# Patient Record
Sex: Male | Born: 1980 | Race: White | Hispanic: No | Marital: Single | Smoking: Current every day smoker
Health system: Southern US, Community
[De-identification: ages and names within clinical notes are randomized; demographics above are authoritative.]

## PROBLEM LIST (undated history)

## (undated) ENCOUNTER — Ambulatory Visit: Discharge status: Absent without leave | Payer: Self-pay

## (undated) DIAGNOSIS — Z720 Tobacco use: Secondary | ICD-10-CM

## (undated) DIAGNOSIS — T7840XA Allergy, unspecified, initial encounter: Secondary | ICD-10-CM

## (undated) DIAGNOSIS — F419 Anxiety disorder, unspecified: Secondary | ICD-10-CM

## (undated) HISTORY — PX: HAND SURGERY: SHX662

## (undated) HISTORY — DX: Tobacco use: Z72.0

## (undated) HISTORY — DX: Allergy, unspecified, initial encounter: T78.40XA

---

## 2002-04-04 ENCOUNTER — Emergency Department (HOSPITAL_COMMUNITY): Admission: EM | Admit: 2002-04-04 | Discharge: 2002-04-05 | Payer: Self-pay | Admitting: Emergency Medicine

## 2002-04-05 ENCOUNTER — Encounter: Payer: Self-pay | Admitting: *Deleted

## 2002-04-05 ENCOUNTER — Encounter: Payer: Self-pay | Admitting: Emergency Medicine

## 2002-05-02 ENCOUNTER — Emergency Department (HOSPITAL_COMMUNITY): Admission: EM | Admit: 2002-05-02 | Discharge: 2002-05-02 | Payer: Self-pay | Admitting: Internal Medicine

## 2010-06-13 ENCOUNTER — Emergency Department (HOSPITAL_COMMUNITY): Admission: EM | Admit: 2010-06-13 | Discharge: 2010-06-13 | Payer: Self-pay | Admitting: Emergency Medicine

## 2010-06-18 ENCOUNTER — Ambulatory Visit (HOSPITAL_BASED_OUTPATIENT_CLINIC_OR_DEPARTMENT_OTHER): Admission: RE | Admit: 2010-06-18 | Discharge: 2010-06-18 | Payer: Self-pay | Admitting: Orthopedic Surgery

## 2010-06-26 ENCOUNTER — Encounter (INDEPENDENT_AMBULATORY_CARE_PROVIDER_SITE_OTHER): Payer: Self-pay | Admitting: *Deleted

## 2010-10-27 NOTE — Letter (Signed)
Summary: *Orthopedic No Show Letter  [facility]  [address]  [place], [place] [address]   Phone: [phone]  Fax: [phone]      06/26/2010    Jeffrey Wyatt [address] [place], [place]  [address]    Dear [name],   Our records indicate that you missed your scheduled appointment with Dr. Beaulah Corin on  06/22/10, following your visit to [facility] Emergency Room.   Please contact this office to reschedule your appointment as soon as possible.  It is important that you keep your scheduled appointments with your physician, so we can provide you the best care possible.        Sincerely,   Dr. Terrance Mass, MD [facility] Phone [phone]

## 2010-12-10 LAB — POCT HEMOGLOBIN-HEMACUE: Hemoglobin: 14 g/dL (ref 13.0–17.0)

## 2013-09-08 ENCOUNTER — Encounter (HOSPITAL_COMMUNITY): Payer: Self-pay | Admitting: Emergency Medicine

## 2013-09-08 ENCOUNTER — Emergency Department (HOSPITAL_COMMUNITY)
Admission: EM | Admit: 2013-09-08 | Discharge: 2013-09-08 | Disposition: A | Discharge status: Home / Self Care | Payer: No Typology Code available for payment source | Attending: Emergency Medicine | Admitting: Emergency Medicine

## 2013-09-08 DIAGNOSIS — F17200 Nicotine dependence, unspecified, uncomplicated: Secondary | ICD-10-CM | POA: Insufficient documentation

## 2013-09-08 DIAGNOSIS — Y92410 Unspecified street and highway as the place of occurrence of the external cause: Secondary | ICD-10-CM | POA: Insufficient documentation

## 2013-09-08 DIAGNOSIS — S139XXA Sprain of joints and ligaments of unspecified parts of neck, initial encounter: Secondary | ICD-10-CM | POA: Insufficient documentation

## 2013-09-08 DIAGNOSIS — S0003XA Contusion of scalp, initial encounter: Secondary | ICD-10-CM | POA: Insufficient documentation

## 2013-09-08 DIAGNOSIS — Y9389 Activity, other specified: Secondary | ICD-10-CM | POA: Insufficient documentation

## 2013-09-08 DIAGNOSIS — S161XXA Strain of muscle, fascia and tendon at neck level, initial encounter: Secondary | ICD-10-CM

## 2013-09-08 DIAGNOSIS — S0990XA Unspecified injury of head, initial encounter: Secondary | ICD-10-CM | POA: Insufficient documentation

## 2013-09-08 MED ORDER — IBUPROFEN 800 MG PO TABS: 800.0000 mg | ORAL_TABLET | Freq: Three times a day (TID) | ORAL | Status: DC

## 2013-09-08 MED ORDER — CYCLOBENZAPRINE HCL 10 MG PO TABS: 10.0000 mg | ORAL_TABLET | Freq: Three times a day (TID) | ORAL | Status: DC | PRN
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
## 2014-05-23 LAB — HIV ANTIBODY (ROUTINE TESTING W REFLEX): HIV 1&2 Ab, 4th Generation: NONREACTIVE

## 2014-07-04 ENCOUNTER — Ambulatory Visit (INDEPENDENT_AMBULATORY_CARE_PROVIDER_SITE_OTHER): Payer: BC Managed Care – PPO | Admitting: Otolaryngology

## 2014-07-04 DIAGNOSIS — R1312 Dysphagia, oropharyngeal phase: Secondary | ICD-10-CM

## 2014-07-05 ENCOUNTER — Other Ambulatory Visit (INDEPENDENT_AMBULATORY_CARE_PROVIDER_SITE_OTHER): Payer: Self-pay | Admitting: Otolaryngology

## 2014-07-05 DIAGNOSIS — R1310 Dysphagia, unspecified: Secondary | ICD-10-CM

## 2014-07-09 ENCOUNTER — Inpatient Hospital Stay (HOSPITAL_COMMUNITY): Admission: RE | Admit: 2014-07-09 | Payer: BC Managed Care – PPO | Source: Ambulatory Visit

## 2014-07-29 ENCOUNTER — Ambulatory Visit (HOSPITAL_COMMUNITY)
Admission: RE | Admit: 2014-07-29 | Discharge: 2014-07-29 | Disposition: A | Discharge status: Home / Self Care | Payer: BC Managed Care – PPO | Source: Ambulatory Visit | Attending: Otolaryngology | Admitting: Otolaryngology

## 2014-07-29 DIAGNOSIS — R1310 Dysphagia, unspecified: Secondary | ICD-10-CM | POA: Insufficient documentation

## 2014-08-01 ENCOUNTER — Ambulatory Visit (INDEPENDENT_AMBULATORY_CARE_PROVIDER_SITE_OTHER): Payer: BC Managed Care – PPO | Admitting: Otolaryngology

## 2015-03-13 ENCOUNTER — Encounter: Payer: Self-pay | Admitting: Gastroenterology

## 2015-04-09 ENCOUNTER — Ambulatory Visit (INDEPENDENT_AMBULATORY_CARE_PROVIDER_SITE_OTHER): Payer: BLUE CROSS/BLUE SHIELD | Admitting: Gastroenterology

## 2015-04-09 ENCOUNTER — Other Ambulatory Visit: Payer: Self-pay

## 2015-04-09 ENCOUNTER — Encounter: Payer: Self-pay | Admitting: Gastroenterology

## 2015-04-09 VITALS — BP 139/84 | HR 88 | Temp 98.4°F | Ht 73.0 in | Wt 163.8 lb

## 2015-04-09 DIAGNOSIS — R1310 Dysphagia, unspecified: Secondary | ICD-10-CM

## 2015-04-09 DIAGNOSIS — R1314 Dysphagia, pharyngoesophageal phase: Secondary | ICD-10-CM | POA: Diagnosis not present

## 2015-04-09 DIAGNOSIS — R1319 Other dysphagia: Secondary | ICD-10-CM

## 2015-04-09 NOTE — Progress Notes (Signed)
Primary Care Physician:  GOLDING, JOHN CABOT, MD  Primary Gastroenterologist:  Michael Rourk, MD   Chief Complaint  Patient presents with  . Dysphagia    HPI:  Jeffrey Wyatt is a 33 y.o. male here for further evaluation of difficulty swallowing, at the request of Dr. John Golding. Patient reports symptoms going on for about a year. Has trouble swallowing almost any solid food. Feels food stick in his chest. At times has had to vomit the food up to get relief. Does okay with liquids. Nothing seems to make better. Worse if he doesn't chew food thoroughly. No associated weight loss. Rarely has heartburn. No abdominal pain. Bowel function is normal. No blood in the stool or melena.  Patient states he saw Dr. Teoh last year for similar symptoms and was diagnosed with acid reflux. States he was on medication for short period of time but didn't seem to help. States he had a fiberoptic study which showed the reflux. Barium esophagram normal in 07/2014.      Current Outpatient Prescriptions  Medication Sig Dispense Refill  . CHANTIX STARTING MONTH PAK 0.5 MG X 11 & 1 MG X 42 tablet      No current facility-administered medications for this visit.    Allergies as of 04/09/2015  . (No Known Allergies)    Past Medical History  Diagnosis Date  . Tobacco use     Past Surgical History  Procedure Laterality Date  . Hand surgery      left    Family History  Problem Relation Age of Onset  . Colon cancer Neg Hx     History   Social History  . Marital Status: Single    Spouse Name: N/A  . Number of Children: 0  . Years of Education: N/A   Occupational History  . [employer]    Social History Main Topics  . Smoking status: Current Every Day Smoker  . Smokeless tobacco: Not on file  . Alcohol Use: No  . Drug Use: No  . Sexual Activity: Not on file   Other Topics Concern  . Not on file   Social History Narrative      ROS:  General: Negative for anorexia, weight loss,  fever, chills, fatigue, weakness. Eyes: Negative for vision changes.  ENT: Negative for hoarseness, difficulty swallowing , nasal congestion. CV: Negative for chest pain, angina, palpitations, dyspnea on exertion, peripheral edema.  Respiratory: Negative for dyspnea at rest, dyspnea on exertion, cough, sputum, wheezing.  GI: See history of present illness. GU:  Negative for dysuria, hematuria, urinary incontinence, urinary frequency, nocturnal urination.  MS: Negative for joint pain, low back pain.  Derm: Negative for rash or itching.  Neuro: Negative for weakness, abnormal sensation, seizure, frequent headaches, memory loss, confusion.  Psych: Negative for anxiety, depression, suicidal ideation, hallucinations.  Endo: Negative for unusual weight change.  Heme: Negative for bruising or bleeding. Allergy: Negative for rash or hives.    Physical Examination:  BP 139/84 mmHg  Pulse 88  Temp(Src) 98.4 F (36.9 C) (Oral)  Ht 6' 1" (1.854 m)  Wt 163 lb 12.8 oz (74.299 kg)  BMI 21.62 kg/m2   General: Well-nourished, well-developed in no acute distress.  Head: Normocephalic, atraumatic.   Eyes: Conjunctiva pink, no icterus. Mouth: Oropharyngeal mucosa moist and pink , no lesions erythema or exudate. Neck: Supple without thyromegaly, masses, or lymphadenopathy.  Lungs: Clear to auscultation bilaterally.  Heart: Regular rate and rhythm, no murmurs rubs or gallops.  Abdomen: Bowel   sounds are normal, nontender, nondistended, no hepatosplenomegaly or masses, no abdominal bruits or    hernia , no rebound or guarding.   Rectal: not performed Extremities: No lower extremity edema. No clubbing or deformities.  Neuro: Alert and oriented x 4 , grossly normal neurologically.  Skin: Warm and dry, no rash or jaundice.   Psych: Alert and cooperative, normal mood and affect.   Imaging Studies: No results found.

## 2015-04-09 NOTE — Patient Instructions (Signed)
Upper endoscopy as scheduled. See separate instructions.  

## 2015-04-09 NOTE — Assessment & Plan Note (Signed)
One-year history of solid food esophageal dysphagia without significant reflux symptoms. Patient reports diagnosis of GERD by ENT at time of fiberoptic study. PPI therapy did not seem to make a difference. At this point given ongoing symptoms would recommend upper endoscopy with esophageal dilation. Differential diagnosis includes eosinophilic esophagitis, esophageal web/ring/stricture  I have discussed the risks, alternatives, benefits with regards to but not limited to the risk of reaction to medication, bleeding, infection, perforation and the patient is agreeable to proceed. Written consent to be obtained.

## 2015-04-10 ENCOUNTER — Ambulatory Visit (HOSPITAL_COMMUNITY)
Admission: RE | Admit: 2015-04-10 | Discharge: 2015-04-10 | Disposition: A | Discharge status: Home / Self Care | Payer: BLUE CROSS/BLUE SHIELD | Source: Ambulatory Visit | Attending: Internal Medicine | Admitting: Internal Medicine

## 2015-04-10 ENCOUNTER — Encounter (HOSPITAL_COMMUNITY): Payer: Self-pay | Admitting: *Deleted

## 2015-04-10 ENCOUNTER — Encounter (HOSPITAL_COMMUNITY)
Admission: RE | Disposition: A | Discharge status: Home / Self Care | Payer: Self-pay | Source: Ambulatory Visit | Attending: Internal Medicine

## 2015-04-10 DIAGNOSIS — Z720 Tobacco use: Secondary | ICD-10-CM | POA: Diagnosis not present

## 2015-04-10 DIAGNOSIS — K2100 Gastro-esophageal reflux disease with esophagitis, without bleeding: Secondary | ICD-10-CM | POA: Insufficient documentation

## 2015-04-10 DIAGNOSIS — R1314 Dysphagia, pharyngoesophageal phase: Secondary | ICD-10-CM | POA: Insufficient documentation

## 2015-04-10 DIAGNOSIS — K449 Diaphragmatic hernia without obstruction or gangrene: Secondary | ICD-10-CM | POA: Insufficient documentation

## 2015-04-10 DIAGNOSIS — K3189 Other diseases of stomach and duodenum: Secondary | ICD-10-CM | POA: Insufficient documentation

## 2015-04-10 HISTORY — PX: ESOPHAGEAL DILATION: SHX303

## 2015-04-10 HISTORY — PX: ESOPHAGOGASTRODUODENOSCOPY: SHX5428

## 2015-04-10 SURGERY — EGD (ESOPHAGOGASTRODUODENOSCOPY)
Anesthesia: Moderate Sedation

## 2015-04-10 MED ORDER — MEPERIDINE HCL 100 MG/ML IJ SOLN
INTRAMUSCULAR | Status: DC | PRN
  Administered 2015-04-10: 25 mg via INTRAVENOUS
  Administered 2015-04-10 (×2): 50 mg via INTRAVENOUS

## 2015-04-10 MED ORDER — MEPERIDINE HCL 100 MG/ML IJ SOLN
INTRAMUSCULAR | Status: AC
  Filled 2015-04-10: qty 2

## 2015-04-10 MED ORDER — MIDAZOLAM HCL 5 MG/5ML IJ SOLN
INTRAMUSCULAR | Status: AC
  Filled 2015-04-10: qty 10

## 2015-04-10 MED ORDER — ONDANSETRON HCL 4 MG/2ML IJ SOLN
INTRAMUSCULAR | Status: DC | PRN
  Administered 2015-04-10: 4 mg via INTRAVENOUS

## 2015-04-10 MED ORDER — SODIUM CHLORIDE 0.9 % IV SOLN
INTRAVENOUS | Status: DC
  Administered 2015-04-10: 13:00:00 via INTRAVENOUS

## 2015-04-10 MED ORDER — LIDOCAINE VISCOUS 2 % MT SOLN
OROMUCOSAL | Status: DC | PRN
  Administered 2015-04-10: 3 mL via OROMUCOSAL

## 2015-04-10 MED ORDER — ONDANSETRON HCL 4 MG/2ML IJ SOLN
INTRAMUSCULAR | Status: AC
  Filled 2015-04-10: qty 2

## 2015-04-10 MED ORDER — SIMETHICONE 40 MG/0.6ML PO SUSP
ORAL | Status: DC | PRN
  Administered 2015-04-10: 14:00:00

## 2015-04-10 MED ORDER — MIDAZOLAM HCL 5 MG/5ML IJ SOLN
INTRAMUSCULAR | Status: DC | PRN
  Administered 2015-04-10: 1 mg via INTRAVENOUS
  Administered 2015-04-10 (×3): 2 mg via INTRAVENOUS
  Administered 2015-04-10: 1 mg via INTRAVENOUS

## 2015-04-10 MED ORDER — LIDOCAINE VISCOUS 2 % MT SOLN
OROMUCOSAL | Status: AC
  Filled 2015-04-10: qty 15

## 2015-04-10 NOTE — Discharge Instructions (Addendum)
EGD Discharge instructions Please read the instructions outlined below and refer to this sheet in the next few weeks. These discharge instructions provide you with general information on caring for yourself after you leave the hospital. Your doctor may also give you specific instructions. While your treatment has been planned according to the most current medical practices available, unavoidable complications occasionally occur. If you have any problems or questions after discharge, please call your doctor. ACTIVITY  You may resume your regular activity but move at a slower pace for the next 24 hours.   Take frequent rest periods for the next 24 hours.   Walking will help expel (get rid of) the air and reduce the bloated feeling in your abdomen.   No driving for 24 hours (because of the anesthesia (medicine) used during the test).   You may shower.   Do not sign any important legal documents or operate any machinery for 24 hours (because of the anesthesia used during the test).  NUTRITION  Drink plenty of fluids.   You may resume your normal diet.   Begin with a light meal and progress to your normal diet.   Avoid alcoholic beverages for 24 hours or as instructed by your caregiver.  MEDICATIONS  You may resume your normal medications unless your caregiver tells you otherwise.  WHAT YOU CAN EXPECT TODAY  You may experience abdominal discomfort such as a feeling of fullness or gas pains.  FOLLOW-UP  Your doctor will discuss the results of your test with you.  SEEK IMMEDIATE MEDICAL ATTENTION IF ANY OF THE FOLLOWING OCCUR:  Excessive nausea (feeling sick to your stomach) and/or vomiting.   Severe abdominal pain and distention (swelling).   Trouble swallowing.   Temperature over 101 F (37.8 C).   Rectal bleeding or vomiting of blood.    GERD information provided  Would cut back on Goody powders-they are damaging your stomach  Begin Dexilant 60 mg daily- go by my  office for free samples  Further recommendations to follow pending review of pathology report  Gastroesophageal Reflux Disease, Adult Gastroesophageal reflux disease (GERD) happens when acid from your stomach flows up into the esophagus. When acid comes in contact with the esophagus, the acid causes soreness (inflammation) in the esophagus. Over time, GERD may create small holes (ulcers) in the lining of the esophagus. CAUSES   Increased body weight. This puts pressure on the stomach, making acid rise from the stomach into the esophagus.  Smoking. This increases acid production in the stomach.  Drinking alcohol. This causes decreased pressure in the lower esophageal sphincter (valve or ring of muscle between the esophagus and stomach), allowing acid from the stomach into the esophagus.  Late evening meals and a full stomach. This increases pressure and acid production in the stomach.  A malformed lower esophageal sphincter. Sometimes, no cause is found. SYMPTOMS   Burning pain in the lower part of the mid-chest behind the breastbone and in the mid-stomach area. This may occur twice a week or more often.  Trouble swallowing.  Sore throat.  Dry cough.  Asthma-like symptoms including chest tightness, shortness of breath, or wheezing. DIAGNOSIS  Your caregiver may be able to diagnose GERD based on your symptoms. In some cases, X-rays and other tests may be done to check for complications or to check the condition of your stomach and esophagus. TREATMENT  Your caregiver may recommend over-the-counter or prescription medicines to help decrease acid production. Ask your caregiver before starting or adding any new  medicines.  HOME CARE INSTRUCTIONS   Change the factors that you can control. Ask your caregiver for guidance concerning weight loss, quitting smoking, and alcohol consumption.  Avoid foods and drinks that make your symptoms worse, such as:  Caffeine or alcoholic  drinks.  Chocolate.  Peppermint or mint flavorings.  Garlic and onions.  Spicy foods.  Citrus fruits, such as oranges, lemons, or limes.  Tomato-based foods such as sauce, chili, salsa, and pizza.  Fried and fatty foods.  Avoid lying down for the 3 hours prior to your bedtime or prior to taking a nap.  Eat small, frequent meals instead of large meals.  Wear loose-fitting clothing. Do not wear anything tight around your waist that causes pressure on your stomach.  Raise the head of your bed 6 to 8 inches with wood blocks to help you sleep. Extra pillows will not help.  Only take over-the-counter or prescription medicines for pain, discomfort, or fever as directed by your caregiver.  Do not take aspirin, ibuprofen, or other nonsteroidal anti-inflammatory drugs (NSAIDs). SEEK IMMEDIATE MEDICAL CARE IF:   You have pain in your arms, neck, jaw, teeth, or back.  Your pain increases or changes in intensity or duration.  You develop nausea, vomiting, or sweating (diaphoresis).  You develop shortness of breath, or you faint.  Your vomit is green, yellow, black, or looks like coffee grounds or blood.  Your stool is red, bloody, or black. These symptoms could be signs of other problems, such as heart disease, gastric bleeding, or esophageal bleeding. MAKE SURE YOU:   Understand these instructions.  Will watch your condition.  Will get help right away if you are not doing well or get worse. Document Released: 06/23/2005 Document Revised: 12/06/2011 Document Reviewed: 04/02/2011 [facility] Patient Information 2015 [employer] [place]. This information is not intended to replace advice given to you by your health care provider. Make sure you discuss any questions you have with your health care provider.

## 2015-04-10 NOTE — H&P (View-Only) (Signed)
Primary Care Physician:  Colette Ribas, MD  Primary Gastroenterologist:  Roetta Sessions, MD   Chief Complaint  Patient presents with  . Dysphagia    HPI:  Jeffrey Wyatt is a 34 y.o. male here for further evaluation of difficulty swallowing, at the request of Dr. Assunta Found. Patient reports symptoms going on for about a year. Has trouble swallowing almost any solid food. Feels food stick in his chest. At times has had to vomit the food up to get relief. Does okay with liquids. Nothing seems to make better. Worse if he doesn't chew food thoroughly. No associated weight loss. Rarely has heartburn. No abdominal pain. Bowel function is normal. No blood in the stool or melena.  Patient states he saw Dr. Suszanne Conners last year for similar symptoms and was diagnosed with acid reflux. States he was on medication for short period of time but didn't seem to help. States he had a fiberoptic study which showed the reflux. Barium esophagram normal in 07/2014.      Current Outpatient Prescriptions  Medication Sig Dispense Refill  . CHANTIX STARTING MONTH PAK 0.5 MG X 11 & 1 MG X 42 tablet      No current facility-administered medications for this visit.    Allergies as of 04/09/2015  . (No Known Allergies)    Past Medical History  Diagnosis Date  . Tobacco use     Past Surgical History  Procedure Laterality Date  . Hand surgery      left    Family History  Problem Relation Age of Onset  . Colon cancer Neg Hx     History   Social History  . Marital Status: Single    Spouse Name: N/A  . Number of Children: 0  . Years of Education: N/A   Occupational History  . [employer]    Social History Main Topics  . Smoking status: Current Every Day Smoker  . Smokeless tobacco: Not on file  . Alcohol Use: No  . Drug Use: No  . Sexual Activity: Not on file   Other Topics Concern  . Not on file   Social History Narrative      ROS:  General: Negative for anorexia, weight loss,  fever, chills, fatigue, weakness. Eyes: Negative for vision changes.  ENT: Negative for hoarseness, difficulty swallowing , nasal congestion. CV: Negative for chest pain, angina, palpitations, dyspnea on exertion, peripheral edema.  Respiratory: Negative for dyspnea at rest, dyspnea on exertion, cough, sputum, wheezing.  GI: See history of present illness. GU:  Negative for dysuria, hematuria, urinary incontinence, urinary frequency, nocturnal urination.  MS: Negative for joint pain, low back pain.  Derm: Negative for rash or itching.  Neuro: Negative for weakness, abnormal sensation, seizure, frequent headaches, memory loss, confusion.  Psych: Negative for anxiety, depression, suicidal ideation, hallucinations.  Endo: Negative for unusual weight change.  Heme: Negative for bruising or bleeding. Allergy: Negative for rash or hives.    Physical Examination:  BP 139/84 mmHg  Pulse 88  Temp(Src) 98.4 F (36.9 C) (Oral)  Ht  (1.854 m)  Wt 163 lb 12.8 oz (74.299 kg)  BMI 21.62 kg/m2   General: Well-nourished, well-developed in no acute distress.  Head: Normocephalic, atraumatic.   Eyes: Conjunctiva pink, no icterus. Mouth: Oropharyngeal mucosa moist and pink , no lesions erythema or exudate. Neck: Supple without thyromegaly, masses, or lymphadenopathy.  Lungs: Clear to auscultation bilaterally.  Heart: Regular rate and rhythm, no murmurs rubs or gallops.  Abdomen: Bowel  sounds are normal, nontender, nondistended, no hepatosplenomegaly or masses, no abdominal bruits or    hernia , no rebound or guarding.   Rectal: not performed Extremities: No lower extremity edema. No clubbing or deformities.  Neuro: Alert and oriented x 4 , grossly normal neurologically.  Skin: Warm and dry, no rash or jaundice.   Psych: Alert and cooperative, normal mood and affect.   Imaging Studies: No results found.

## 2015-04-10 NOTE — Interval H&P Note (Signed)
History and Physical Interval Note:  04/10/2015 1:40 PM  Jeffrey Wyatt  has presented today for surgery, with the diagnosis of dysphagia  The various methods of treatment have been discussed with the patient and family. After consideration of risks, benefits and other options for treatment, the patient has consented to  Procedure(s) with comments: ESOPHAGOGASTRODUODENOSCOPY (EGD) (N/A) - 200pm ESOPHAGEAL DILATION (N/A) as a surgical intervention .  The patient's history has been reviewed, patient examined, no change in status, stable for surgery.  I have reviewed the patient's chart and labs.  Questions were answered to the patient's satisfaction.     Jeffrey Wyatt  No change. EGD with esophageal dilation as feasible/appropriate per plan.The risks, benefits, limitations, alternatives and imponderables have been reviewed with the patient. Potential for esophageal dilation, biopsy, etc. have also been reviewed.  Questions have been answered. All parties agreeable.

## 2015-04-10 NOTE — Op Note (Signed)
[facility] [address] [place], [id number]   ENDOSCOPY PROCEDURE REPORT  PATIENT: Jeffrey Wyatt, Jeffrey Wyatt  MR#: [id number] BIRTHDATE: [birth year] , 33  yrs. old GENDER: male ENDOSCOPIST: R.  Roetta SessionsMichael Myha Arizpe, MD FACP FACG REFERRED BY:  Assunta FoundJohn Golding, M.D. PROCEDURE DATE:  04/10/2015 PROCEDURE: INDICATIONS:  Esophageal dysphagia; GERD. MEDICATIONS: Versed 8 mg IV and Demerol 125 mg IV in divided doses. Xylocaine gel orally.  Zofran 4 mg IV. ASA CLASS:      Class II  CONSENT: The risks, benefits, limitations, alternatives and imponderables have been discussed.  The potential for biopsy, esophogeal dilation, etc. have also been reviewed.  Questions have been answered.  All parties agreeable.  Please see the history and physical in the medical record for more information.  DESCRIPTION OF PROCEDURE: After the risks benefits and alternatives of the procedure were thoroughly explained, informed consent was obtained.  The EG-[id number] ([id number] endoscope was introduced through the mouth and advanced to the second portion of the duodenum , limited by Without limitations. The instrument was slowly withdrawn as the mucosa was fully examined. Estimated blood loss is zero unless otherwise noted in this procedure report.    Multiple distal esophageal erosions with mucosal breaks extending from the EG junction 2 cm proximally and at least 4 locations.  The tubular esophagus did appear patent throughout its course.  No nodularity.  No evidence of Barrett's esophagus.  EG junction easily traversed.  Stomach empty.  Small hiatal hernia.  Multiple antral erosions.  No ulcer infiltrating process.  Patent pylorus. Normal-appearing first and second portion of the duodenum  The scope was removed and a 56 [place]French Maloney dilator was passed to full insertion with ease.  A look back revealed no apparent complication related to this maneuver.  Subsequently, biopsies abnormal antrum taken  for histologic study. Retroflexed views revealed a hiatal hernia.     The scope was then withdrawn from the patient and the procedure completed.  COMPLICATIONS: There were no immediate complications.  ENDOSCOPIC IMPRESSION: Erosive reflux esophagitis. Hiatal hernia. Gastric erosions status post gastric biopsy. Patient's family told me after the procedure patient "eats Marlin CanaryGoody powders all the time"  RECOMMENDATIONS: Minimize aspirin use. Begin Dexilant 60 mg daily. Follow up on pathology. Further recommendations to follow.  REPEAT EXAM:  eSigned:  R. Roetta SessionsMichael Adreona Brand, MD Jerrel IvoryFACP [facility] HospitalFACG 04/10/2015 2:29 PM    CC:  CPT CODES: ICD CODES:  The ICD and CPT codes recommended by this software are interpretations from the data that the clinical staff has captured with the software.  The verification of the translation of this report to the ICD and CPT codes and modifiers is the sole responsibility of the health care institution and practicing physician where this report was generated.  [employer] will not be held responsible for the validity of the ICD and CPT codes included on this report.  AMA assumes no liability for data contained or not contained herein. CPT is a Publishing rights managerregistered trademark of the [employer].  PATIENT NAME:  Jeffrey Wyatt, Jeffrey Wyatt MR#: [id number]

## 2015-04-11 ENCOUNTER — Encounter (HOSPITAL_COMMUNITY): Payer: Self-pay | Admitting: Internal Medicine

## 2015-04-14 NOTE — Progress Notes (Signed)
CC'ED TO PCP 

## 2015-04-15 ENCOUNTER — Encounter: Payer: Self-pay | Admitting: Internal Medicine

## 2015-04-23 ENCOUNTER — Emergency Department (HOSPITAL_COMMUNITY)
Admission: EM | Admit: 2015-04-23 | Discharge: 2015-04-23 | Disposition: A | Discharge status: Home / Self Care | Payer: BLUE CROSS/BLUE SHIELD | Attending: Emergency Medicine | Admitting: Emergency Medicine

## 2015-04-23 ENCOUNTER — Encounter (HOSPITAL_COMMUNITY): Payer: Self-pay | Admitting: Emergency Medicine

## 2015-04-23 ENCOUNTER — Emergency Department (HOSPITAL_COMMUNITY): Payer: BLUE CROSS/BLUE SHIELD

## 2015-04-23 DIAGNOSIS — Z79899 Other long term (current) drug therapy: Secondary | ICD-10-CM | POA: Diagnosis not present

## 2015-04-23 DIAGNOSIS — M546 Pain in thoracic spine: Secondary | ICD-10-CM | POA: Diagnosis present

## 2015-04-23 DIAGNOSIS — M549 Dorsalgia, unspecified: Secondary | ICD-10-CM

## 2015-04-23 DIAGNOSIS — Z720 Tobacco use: Secondary | ICD-10-CM | POA: Diagnosis not present

## 2015-04-23 MED ORDER — OXYCODONE-ACETAMINOPHEN 5-325 MG PO TABS
1.0000 | ORAL_TABLET | ORAL | Status: DC | PRN
Start: 2015-04-23 — End: 2018-10-03

## 2015-04-23 MED ORDER — OXYCODONE-ACETAMINOPHEN 5-325 MG PO TABS
1.0000 | ORAL_TABLET | Freq: Once | ORAL | Status: AC
  Administered 2015-04-23: 1 via ORAL
  Filled 2015-04-23: qty 1

## 2015-04-23 MED ORDER — CYCLOBENZAPRINE HCL 10 MG PO TABS
10.0000 mg | ORAL_TABLET | Freq: Once | ORAL | Status: AC
  Administered 2015-04-23: 10 mg via ORAL
  Filled 2015-04-23: qty 1

## 2015-04-23 MED ORDER — CYCLOBENZAPRINE HCL 10 MG PO TABS: 10.0000 mg | ORAL_TABLET | Freq: Two times a day (BID) | ORAL | Status: DC | PRN

## 2015-04-23 NOTE — ED Notes (Signed)
Pt c/o severe upper back pain since Sunday.

## 2015-04-23 NOTE — ED Notes (Addendum)
Pt states understanding of care given and follow-up instructions.  Reminded pt once again no drinking ETOH or driving a vehicle tonight due to medications given.  Informed no ETOH or driving while taking prescription medications.  Pt ambulated out of ED with Aunt whom agreed to transport pt home

## 2015-04-23 NOTE — ED Provider Notes (Signed)
CSN: [id number]     Arrival date & time 04/23/15  0118 History   First MD Initiated Contact with Patient 04/23/15 0121     Chief Complaint  Patient presents with  . Back Pain     (Consider location/radiation/quality/duration/timing/severity/associated sxs/prior Treatment) The history is provided by the patient.   34 year old male comes in complaining of pain in the right scapular area for the last 4 days. Pain is sharp and worse with movement. There is no radiation of pain. He cannot recall any unusual activity or trauma to precipitate the pain. Pain is rated at 8/10. He has tried taking ibuprofen with no relief.  Past Medical History  Diagnosis Date  . Tobacco use    Past Surgical History  Procedure Laterality Date  . Hand surgery      left  . Esophagogastroduodenoscopy N/A 04/10/2015    Procedure: ESOPHAGOGASTRODUODENOSCOPY (EGD);  Surgeon: Corbin Ade, MD;  Location: [facility];  Service: Endoscopy;  Laterality: N/A;  200pm  . Esophageal dilation N/A 04/10/2015    Procedure: ESOPHAGEAL DILATION;  Surgeon: Corbin Ade, MD;  Location: [facility];  Service: Endoscopy;  Laterality: N/A;   Family History  Problem Relation Age of Onset  . Colon cancer Neg Hx    History  Substance Use Topics  . Smoking status: Current Every Day Smoker  . Smokeless tobacco: Not on file  . Alcohol Use: No    Review of Systems  All other systems reviewed and are negative.     Allergies  Review of patient's allergies indicates no known allergies.  Home Medications   Prior to Admission medications   Medication Sig Start Date End Date Taking? Authorizing Provider  CHANTIX STARTING MONTH PAK 0.5 MG X 11 & 1 MG X 42 tablet  02/21/15   Historical Provider, MD   BP 137/82 mmHg  Pulse 78  Temp(Src) 97.8 F (36.6 C)  Resp 17  Ht  (1.854 m)  Wt 165 lb (74.844 kg)  BMI 21.77 kg/m2  SpO2 100% Physical Exam  Nursing note and vitals reviewed.  34 year old male, resting  comfortably and in no acute distress. Vital signs are normal. Oxygen saturation is 100%, which is normal. Head is normocephalic and atraumatic. PERRLA, EOMI. Oropharynx is clear. Neck is nontender and supple without adenopathy or JVD. Back: There is mild tenderness superior to the right scapula. There is no CVA tenderness. Lungs are clear without rales, wheezes, or rhonchi. Chest is nontender. Heart has regular rate and rhythm without murmur. Abdomen is soft, flat, nontender without masses or hepatosplenomegaly and peristalsis is normoactive. Extremities have no cyanosis or edema, full range of motion is present. Skin is warm and dry without rash. Neurologic: Mental status is normal, cranial nerves are intact, there are no motor or sensory deficits.  ED Course  Procedures (including critical care time)  Imaging Review Dg Chest 2 View  04/23/2015   CLINICAL DATA:  Severe sharp upper back pain for 2 days.  EXAM: CHEST  2 VIEW  COMPARISON:  None.  FINDINGS: Mild linear scarring is present in the apices bilaterally. The lungs are otherwise clear. Hilar, mediastinal and cardiac contours are normal. Heart size is normal. There are no pleural effusions.  IMPRESSION: Mild apical scarring bilaterally. No acute cardiopulmonary abnormality.   Electronically Signed   By: Ellery Plunk M.D.   On: 04/23/2015 02:19     MDM   Final diagnoses:  Upper back pain on right side  Right scapular pain which appears to be muscular. chest x-ray will be obtained. Old records are reviewed and he had a recent upper endoscopy showing erosive gastritis, so he should not be using any NSAIDs. He is given a dose of cyclobenzaprine and oxycodone-acetaminophen.  X-rays are unremarkable. He is discharged with prescriptions for cyclobenzaprine and oxycodone-acetaminophen.  Dione Booze, MD 04/23/15 [phone]

## 2015-04-23 NOTE — Discharge Instructions (Signed)
You may take acetaminophen for pain, but do not take ibuprofen or naproxen - they can irritate your stomach and esophagus.  Back Pain, Adult Low back pain is very common. About 1 in 5 people have back pain.The cause of low back pain is rarely dangerous. The pain often gets better over time.About half of people with a sudden onset of back pain feel better in just 2 weeks. About 8 in 10 people feel better by 6 weeks.  CAUSES Some common causes of back pain include:  Strain of the muscles or ligaments supporting the spine.  Wear and tear (degeneration) of the spinal discs.  Arthritis.  Direct injury to the back. DIAGNOSIS Most of the time, the direct cause of low back pain is not known.However, back pain can be treated effectively even when the exact cause of the pain is unknown.Answering your caregiver's questions about your overall health and symptoms is one of the most accurate ways to make sure the cause of your pain is not dangerous. If your caregiver needs more information, he or she may order lab work or imaging tests (X-rays or MRIs).However, even if imaging tests show changes in your back, this usually does not require surgery. HOME CARE INSTRUCTIONS For many people, back pain returns.Since low back pain is rarely dangerous, it is often a condition that people can learn to [facility] their own.   Remain active. It is stressful on the back to sit or stand in one place. Do not sit, drive, or stand in one place for more than 30 minutes at a time. Take short walks on level surfaces as soon as pain allows.Try to increase the length of time you walk each day.  Do not stay in bed.Resting more than 1 or 2 days can delay your recovery.  Do not avoid exercise or work.Your body is made to move.It is not dangerous to be active, even though your back may hurt.Your back will likely heal faster if you return to being active before your pain is gone.  Pay attention to your body when you  bend and lift. Many people have less discomfortwhen lifting if they bend their knees, keep the load close to their bodies,and avoid twisting. Often, the most comfortable positions are those that put less stress on your recovering back.  Find a comfortable position to sleep. Use a firm mattress and lie on your side with your knees slightly bent. If you lie on your back, put a pillow under your knees.  Only take over-the-counter or prescription medicines as directed by your caregiver. Over-the-counter medicines to reduce pain and inflammation are often the most helpful.Your caregiver may prescribe muscle relaxant drugs.These medicines help dull your pain so you can more quickly return to your normal activities and healthy exercise.  Put ice on the injured area.  Put ice in a plastic bag.  Place a towel between your skin and the bag.  Leave the ice on for 15-20 minutes, 03-04 times a day for the first 2 to 3 days. After that, ice and heat may be alternated to reduce pain and spasms.  Ask your caregiver about trying back exercises and gentle massage. This may be of some benefit.  Avoid feeling anxious or stressed.Stress increases muscle tension and can worsen back pain.It is important to recognize when you are anxious or stressed and learn ways to manage it.Exercise is a great option. SEEK MEDICAL CARE IF:  You have pain that is not relieved with rest or medicine.  You  have pain that does not improve in 1 week.  You have new symptoms.  You are generally not feeling well. SEEK IMMEDIATE MEDICAL CARE IF:   You have pain that radiates from your back into your legs.  You develop new bowel or bladder control problems.  You have unusual weakness or numbness in your arms or legs.  You develop nausea or vomiting.  You develop abdominal pain.  You feel faint. Document Released: 09/13/2005 Document Revised: 03/14/2012 Document Reviewed: 01/15/2014 [facility] Patient Information 2015  [place], [place]. This information is not intended to replace advice given to you by your health care provider. Make sure you discuss any questions you have with your health care provider.  Cyclobenzaprine tablets What is this medicine? CYCLOBENZAPRINE (sye kloe BEN za preen) is a muscle relaxer. It is used to treat muscle pain, spasms, and stiffness. This medicine may be used for other purposes; ask your health care provider or pharmacist if you have questions. COMMON BRAND NAME(S): Fexmid, Flexeril What should I tell my health care provider before I take this medicine? They need to know if you have any of these conditions: -heart disease, irregular heartbeat, or previous heart attack -liver disease -thyroid problem -an unusual or allergic reaction to cyclobenzaprine, tricyclic antidepressants, lactose, other medicines, foods, dyes, or preservatives -pregnant or trying to get pregnant -breast-feeding How should I use this medicine? Take this medicine by mouth with a glass of water. Follow the directions on the prescription label. If this medicine upsets your stomach, take it with food or milk. Take your medicine at regular intervals. Do not take it more often than directed. Talk to your pediatrician regarding the use of this medicine in children. Special care may be needed. Overdosage: If you think you have taken too much of this medicine contact a poison control center or emergency room at once. NOTE: This medicine is only for you. Do not share this medicine with others. What if I miss a dose? If you miss a dose, take it as soon as you can. If it is almost time for your next dose, take only that dose. Do not take double or extra doses. What may interact with this medicine? Do not take this medicine with any of the following medications: -certain medicines for fungal infections like fluconazole, itraconazole, ketoconazole, posaconazole,  voriconazole -cisapride -dofetilide -dronedarone -droperidol -flecainide -grepafloxacin -halofantrine -levomethadyl -MAOIs like Carbex, Eldepryl, Marplan, Nardil, and Parnate -nilotinib -pimozide -probucol -sertindole -thioridazine -ziprasidone This medicine may also interact with the following medications: -abarelix -alcohol -certain medicines for cancer -certain medicines for depression, anxiety, or psychotic disturbances -certain medicines for infection like alfuzosin, chloroquine, clarithromycin, levofloxacin, mefloquine, pentamidine, troleandomycin -certain medicines for an irregular heart beat -certain medicines used for sleep or numbness during surgery or procedure -contrast dyes -dolasetron -guanethidine -methadone -octreotide -ondansetron -other medicines that prolong the QT interval (cause an abnormal heart rhythm) -palonosetron -phenothiazines like chlorpromazine, mesoridazine, prochlorperazine, thioridazine -tramadol -vardenafil This list may not describe all possible interactions. Give your health care provider a list of all the medicines, herbs, non-prescription drugs, or dietary supplements you use. Also tell them if you smoke, drink alcohol, or use illegal drugs. Some items may interact with your medicine. What should I watch for while using this medicine? Check with your doctor or health care professional if your condition does not improve within 1 to 3 weeks. You may get drowsy or dizzy when you first start taking the medicine or change doses. Do not drive, use machinery, or do anything  that may be dangerous until you know how the medicine affects you. Stand or sit up slowly. Your mouth may get dry. Drinking water, chewing sugarless gum, or sucking on hard candy may help. What side effects may I notice from receiving this medicine? Side effects that you should report to your doctor or health care professional as soon as possible: -allergic reactions like  skin rash, itching or hives, swelling of the face, lips, or tongue -chest pain -fast heartbeat -hallucinations -seizures -vomiting Side effects that usually do not require medical attention (report to your doctor or health care professional if they continue or are bothersome): -headache This list may not describe all possible side effects. Call your doctor for medical advice about side effects. You may report side effects to FDA at 1-800-FDA-1088. Where should I keep my medicine? Keep out of the reach of children. Store at room temperature between 15 and 30 degrees C (59 and 86 degrees F). Keep container tightly closed. Throw away any unused medicine after the expiration date. NOTE: This sheet is a summary. It may not cover all possible information. If you have questions about this medicine, talk to your doctor, pharmacist, or health care provider.  2015, [employer] Standard. (2013-04-10 12:48:19)  Acetaminophen; Oxycodone tablets What is this medicine? ACETAMINOPHEN; OXYCODONE (a set a MEE noe fen; ox i KOE done) is a pain reliever. It is used to treat mild to moderate pain. This medicine may be used for other purposes; ask your health care provider or pharmacist if you have questions. COMMON BRAND NAME(S): Endocet, Magnacet, Narvox, Percocet, Perloxx, Primalev, Primlev, Roxicet, Xolox What should I tell my health care provider before I take this medicine? They need to know if you have any of these conditions: -brain tumor -Crohn's disease, inflammatory bowel disease, or ulcerative colitis -drug abuse or addiction -head injury -heart or circulation problems -if you often drink alcohol -kidney disease or problems going to the bathroom -liver disease -lung disease, asthma, or breathing problems -an unusual or allergic reaction to acetaminophen, oxycodone, other opioid analgesics, other medicines, foods, dyes, or preservatives -pregnant or trying to get  pregnant -breast-feeding How should I use this medicine? Take this medicine by mouth with a full glass of water. Follow the directions on the prescription label. Take your medicine at regular intervals. Do not take your medicine more often than directed. Talk to your pediatrician regarding the use of this medicine in children. Special care may be needed. Patients over 45 years old may have a stronger reaction and need a smaller dose. Overdosage: If you think you have taken too much of this medicine contact a poison control center or emergency room at once. NOTE: This medicine is only for you. Do not share this medicine with others. What if I miss a dose? If you miss a dose, take it as soon as you can. If it is almost time for your next dose, take only that dose. Do not take double or extra doses. What may interact with this medicine? -alcohol -antihistamines -barbiturates like amobarbital, butalbital, butabarbital, methohexital, pentobarbital, phenobarbital, thiopental, and secobarbital -benztropine -drugs for bladder problems like solifenacin, trospium, oxybutynin, tolterodine, hyoscyamine, and methscopolamine -drugs for breathing problems like ipratropium and tiotropium -drugs for certain stomach or intestine problems like propantheline, homatropine methylbromide, glycopyrrolate, atropine, belladonna, and dicyclomine -general anesthetics like etomidate, ketamine, nitrous oxide, propofol, desflurane, enflurane, halothane, isoflurane, and sevoflurane -medicines for depression, anxiety, or psychotic disturbances -medicines for sleep -muscle relaxants -naltrexone -narcotic medicines (opiates) for pain -phenothiazines like  perphenazine, thioridazine, chlorpromazine, mesoridazine, fluphenazine, prochlorperazine, promazine, and trifluoperazine -scopolamine -tramadol -trihexyphenidyl This list may not describe all possible interactions. Give your health care provider a list of all the  medicines, herbs, non-prescription drugs, or dietary supplements you use. Also tell them if you smoke, drink alcohol, or use illegal drugs. Some items may interact with your medicine. What should I watch for while using this medicine? Tell your doctor or health care professional if your pain does not go away, if it gets worse, or if you have new or a different type of pain. You may develop tolerance to the medicine. Tolerance means that you will need a higher dose of the medication for pain relief. Tolerance is normal and is expected if you take this medicine for a long time. Do not suddenly stop taking your medicine because you may develop a severe reaction. Your body becomes used to the medicine. This does NOT mean you are addicted. Addiction is a behavior related to getting and using a drug for a non-medical reason. If you have pain, you have a medical reason to take pain medicine. Your doctor will tell you how much medicine to take. If your doctor wants you to stop the medicine, the dose will be slowly lowered over time to avoid any side effects. You may get drowsy or dizzy. Do not drive, use machinery, or do anything that needs mental alertness until you know how this medicine affects you. Do not stand or sit up quickly, especially if you are an older patient. This reduces the risk of dizzy or fainting spells. Alcohol may interfere with the effect of this medicine. Avoid alcoholic drinks. There are different types of narcotic medicines (opiates) for pain. If you take more than one type at the same time, you may have more side effects. Give your health care provider a list of all medicines you use. Your doctor will tell you how much medicine to take. Do not take more medicine than directed. Call emergency for help if you have problems breathing. The medicine will cause constipation. Try to have a bowel movement at least every 2 to 3 days. If you do not have a bowel movement for 3 days, call your doctor or  health care professional. Do not take Tylenol (acetaminophen) or medicines that have acetaminophen with this medicine. Too much acetaminophen can be very dangerous. Many nonprescription medicines contain acetaminophen. Always read the labels carefully to avoid taking more acetaminophen. What side effects may I notice from receiving this medicine? Side effects that you should report to your doctor or health care professional as soon as possible: -allergic reactions like skin rash, itching or hives, swelling of the face, lips, or tongue -breathing difficulties, wheezing -confusion -light headedness or fainting spells -severe stomach pain -unusually weak or tired -yellowing of the skin or the whites of the eyes Side effects that usually do not require medical attention (report to your doctor or health care professional if they continue or are bothersome): -dizziness -drowsiness -nausea -vomiting This list may not describe all possible side effects. Call your doctor for medical advice about side effects. You may report side effects to FDA at 1-800-FDA-1088. Where should I keep my medicine? Keep out of the reach of children. This medicine can be abused. Keep your medicine in a safe place to protect it from theft. Do not share this medicine with anyone. Selling or giving away this medicine is dangerous and against the law. Store at room temperature between 20 and 25 degrees  C (68 and 77 degrees F). Keep container tightly closed. Protect from light. This medicine may cause accidental overdose and death if it is taken by other adults, children, or pets. Flush any unused medicine down the toilet to reduce the chance of harm. Do not use the medicine after the expiration date. NOTE: This sheet is a summary. It may not cover all possible information. If you have questions about this medicine, talk to your doctor, pharmacist, or health care provider.  2015, [employer] Standard. (2013-05-07 13:17:35)

## 2015-06-12 ENCOUNTER — Ambulatory Visit: Payer: BLUE CROSS/BLUE SHIELD | Admitting: Gastroenterology

## 2015-06-25 ENCOUNTER — Encounter: Payer: Self-pay | Admitting: Gastroenterology

## 2015-06-25 ENCOUNTER — Ambulatory Visit: Payer: BLUE CROSS/BLUE SHIELD | Admitting: Gastroenterology

## 2018-10-03 ENCOUNTER — Encounter (HOSPITAL_COMMUNITY): Payer: Self-pay | Admitting: Emergency Medicine

## 2018-10-03 ENCOUNTER — Emergency Department (HOSPITAL_COMMUNITY): Payer: Self-pay

## 2018-10-03 ENCOUNTER — Emergency Department (HOSPITAL_COMMUNITY)
Admission: EM | Admit: 2018-10-03 | Discharge: 2018-10-03 | Disposition: A | Discharge status: Home / Self Care | Payer: Self-pay | Attending: Emergency Medicine | Admitting: Emergency Medicine

## 2018-10-03 ENCOUNTER — Other Ambulatory Visit: Payer: Self-pay

## 2018-10-03 DIAGNOSIS — R079 Chest pain, unspecified: Secondary | ICD-10-CM

## 2018-10-03 DIAGNOSIS — F17210 Nicotine dependence, cigarettes, uncomplicated: Secondary | ICD-10-CM | POA: Insufficient documentation

## 2018-10-03 DIAGNOSIS — Z23 Encounter for immunization: Secondary | ICD-10-CM | POA: Insufficient documentation

## 2018-10-03 DIAGNOSIS — Z79899 Other long term (current) drug therapy: Secondary | ICD-10-CM | POA: Insufficient documentation

## 2018-10-03 DIAGNOSIS — R55 Syncope and collapse: Secondary | ICD-10-CM

## 2018-10-03 LAB — URINALYSIS, ROUTINE W REFLEX MICROSCOPIC
Bilirubin Urine: NEGATIVE
Glucose, UA: NEGATIVE mg/dL
Hgb urine dipstick: NEGATIVE
Ketones, ur: NEGATIVE mg/dL
LEUKOCYTES UA: NEGATIVE
NITRITE: NEGATIVE
PROTEIN: NEGATIVE mg/dL
Specific Gravity, Urine: 1.003 — ABNORMAL LOW (ref 1.005–1.030)
pH: 8 (ref 5.0–8.0)

## 2018-10-03 LAB — HEPATIC FUNCTION PANEL
ALT: 12 U/L (ref 0–44)
AST: 18 U/L (ref 15–41)
Albumin: 4.2 g/dL (ref 3.5–5.0)
Alkaline Phosphatase: 56 U/L (ref 38–126)
Bilirubin, Direct: <0.1 mg/dL (ref 0.0–0.2)
Total Bilirubin: 0.4 mg/dL (ref 0.3–1.2)
Total Protein: 7.7 g/dL (ref 6.5–8.1)

## 2018-10-03 LAB — BASIC METABOLIC PANEL
Anion gap: 7 (ref 5–15)
BUN: 8 mg/dL (ref 6–20)
CO2: 26 mmol/L (ref 22–32)
Calcium: 9.5 mg/dL (ref 8.9–10.3)
Chloride: 103 mmol/L (ref 98–111)
Creatinine, Ser: 0.67 mg/dL (ref 0.61–1.24)
Glucose, Bld: 93 mg/dL (ref 70–99)
POTASSIUM: 3.9 mmol/L (ref 3.5–5.1)
Sodium: 136 mmol/L (ref 135–145)

## 2018-10-03 LAB — CBC
HCT: 45.9 % (ref 39.0–52.0)
HEMOGLOBIN: 14.5 g/dL (ref 13.0–17.0)
MCH: 30.5 pg (ref 26.0–34.0)
MCHC: 31.6 g/dL (ref 30.0–36.0)
MCV: 96.4 fL (ref 80.0–100.0)
NRBC: 0 % (ref 0.0–0.2)
PLATELETS: 326 10*3/uL (ref 150–400)
RBC: 4.76 MIL/uL (ref 4.22–5.81)
RDW: 12.1 % (ref 11.5–15.5)
WBC: 11.4 10*3/uL — ABNORMAL HIGH (ref 4.0–10.5)

## 2018-10-03 LAB — RAPID URINE DRUG SCREEN, HOSP PERFORMED
Amphetamines: NOT DETECTED
Barbiturates: NOT DETECTED
Benzodiazepines: POSITIVE — AB
Cocaine: NOT DETECTED
Opiates: NOT DETECTED
Tetrahydrocannabinol: NOT DETECTED

## 2018-10-03 LAB — CBG MONITORING, ED: Glucose-Capillary: 83 mg/dL (ref 70–99)

## 2018-10-03 LAB — TROPONIN I

## 2018-10-03 MED ORDER — LORAZEPAM 0.5 MG PO TABS: 0.5000 mg | ORAL_TABLET | Freq: Three times a day (TID) | ORAL | 0 refills | Status: DC | PRN

## 2018-10-03 MED ORDER — TETANUS-DIPHTH-ACELL PERTUSSIS 5-2.5-18.5 LF-MCG/0.5 IM SUSP
0.5000 mL | Freq: Once | INTRAMUSCULAR | Status: AC
  Administered 2018-10-03: 0.5 mL via INTRAMUSCULAR
  Filled 2018-10-03: qty 0.5

## 2018-10-03 NOTE — ED Triage Notes (Signed)
Patient states he has had black outs and wakes up in a different place. States that he gets disoriented when he wakes up. He states that his first episode happened 1 week ago and then again last Sunday. Patient states he does not sleep well and takes sleeping aids to sleep.

## 2018-10-03 NOTE — ED Provider Notes (Signed)
[facility] Emergency Department Provider Note MRN:  [id number]  Arrival date & time: 10/03/18     Chief Complaint   Blackout spells History of Present Illness   Jeffrey Wyatt is a 38 y.o. year-old male with a history of tobacco abuse presenting to the ED with chief complaint of blackout spells.  Patient explains that 1 week ago he experienced a 6 to 8-hour episode during which she does not remember where he is or what he was doing.  Described as a complete blackout spell.  This is the first time that this is happened.  The same thing happened 2 days ago, it seems that patient climbed over a fence and sustained a laceration to his right leg.  He does not recall any of this.  Patient currently denying any symptoms, no headache or vision change, no chest pain or shortness of breath, no abdominal pain, no numbness or weakness to the arms or legs.  Has been taking up to 8 sleeping pills per night to help him sleep.  Endorsing racing thoughts and anxiety when he tries to sleep.  Very recently was working night shifts at [employer].  Denies other drugs or alcohol.  Patient is also endorsing left-sided chest pain, described as dull, present for 2 days, no exacerbating relieving factors, associated with lightheadedness.  Review of Systems  A complete 10 system review of systems was obtained and all systems are negative except as noted in the HPI and PMH.   Patient's Health History    Past Medical History:  Diagnosis Date  . Tobacco use     Past Surgical History:  Procedure Laterality Date  . ESOPHAGEAL DILATION N/A 04/10/2015   Procedure: ESOPHAGEAL DILATION;  Surgeon: Corbin Adeobert M Rourk, MD;  [facility];  Service: Endoscopy;  Laterality: N/A;  . ESOPHAGOGASTRODUODENOSCOPY N/A 04/10/2015   WUJ:WJXBJYNRMR:erosive reflux esophagitis/HH  . HAND SURGERY     left    Family History  Problem Relation Age of Onset  . Colon cancer Neg Hx     Social History   Socioeconomic  History  . Marital status: Single    Spouse name: Not on file  . Number of children: 0  . Years of education: Not on file  . Highest education level: Not on file  Occupational History  . Occupation: [employer]  Social Needs  . Financial resource strain: Not on file  . Food insecurity:    Worry: Not on file    Inability: Not on file  . Transportation needs:    Medical: Not on file    Non-medical: Not on file  Tobacco Use  . Smoking status: Current Every Day Smoker    Packs/day: 2.00    Types: Cigarettes  . Smokeless tobacco: Never Used  Substance and Sexual Activity  . Alcohol use: No  . Drug use: No  . Sexual activity: Not on file  Lifestyle  . Physical activity:    Days per week: Not on file    Minutes per session: Not on file  . Stress: Not on file  Relationships  . Social connections:    Talks on phone: Not on file    Gets together: Not on file    Attends religious service: Not on file    Active member of club or organization: Not on file    Attends meetings of clubs or organizations: Not on file    Relationship status: Not on file  . Intimate partner violence:    Fear  of current or ex partner: Not on file    Emotionally abused: Not on file    Physically abused: Not on file    Forced sexual activity: Not on file  Other Topics Concern  . Not on file  Social History Narrative  . Not on file     Physical Exam  Vital Signs and Nursing Notes reviewed Vitals:   10/03/18 1412 10/03/18 1658  BP: 135/89 128/76  Pulse: 86 78  Resp: 16 16  Temp: 98 F (36.7 C)   SpO2: 100% 98%    CONSTITUTIONAL: Well-appearing, NAD NEURO:  Alert and oriented x 3, no focal deficits EYES:  eyes equal and reactive ENT/NECK:  no LAD, no JVD CARDIO: Regular rate, well-perfused, normal S1 and S2 PULM:  CTAB no wheezing or rhonchi GI/GU:  normal bowel sounds, non-distended, non-tender MSK/SPINE:  No gross deformities, no edema SKIN:  no rash, atraumatic PSYCH:  Appropriate speech and  behavior  Diagnostic and Interventional Summary    EKG Interpretation  Date/Time:  Tuesday October 03 2018 14:19:22 EST Ventricular Rate:  87 PR Interval:  140 QRS Duration: 100 QT Interval:  358 QTC Calculation: 430 R Axis:   95 Text Interpretation:  Normal sinus rhythm Biatrial enlargement Rightward axis Abnormal ECG Confirmed by Kennis CarinaBero, Averee Harb [phone]([id number]) on 10/03/2018 6:10:42 PM      Labs Reviewed  CBC - Abnormal; Notable for the following components:      Result Value   WBC 11.4 (*)    All other components within normal limits  URINALYSIS, ROUTINE W REFLEX MICROSCOPIC - Abnormal; Notable for the following components:   Color, Urine STRAW (*)    Specific Gravity, Urine 1.003 (*)    All other components within normal limits  RAPID URINE DRUG SCREEN, HOSP PERFORMED - Abnormal; Notable for the following components:   Benzodiazepines POSITIVE (*)    All other components within normal limits  BASIC METABOLIC PANEL  TROPONIN I  HEPATIC FUNCTION PANEL  CBG MONITORING, ED    DG Chest 2 View  Final Result    CT Head Wo Contrast  Final Result      Medications  Tdap (BOOSTRIX) injection 0.5 mL (has no administration in time range)     Procedures Critical Care  ED Course and Medical Decision Making  I have reviewed the triage vital signs and the nursing notes.  Pertinent labs & imaging results that were available during my care of the patient were reviewed by me and considered in my medical decision making (see below for details).  Favored polypharmacy related blackout spells versus psychogenic.  Will screen with CT head.  EKG with nonspecific abnormalities, troponin negative.  CT head unremarkable, work-up largely unrevealing with the exception of UDS positive for benzos.  Upon further questioning, patient has been taking Xanax, obtained from a friend, every day for at least 2 weeks.  This medication mixed with over-the-counter sleeping pills is the most likely explanation  for patient's blackout spells.  Patient explains that he is already disposed of any remaining sleeping pills and Xanax.  I agree with this plan to stop taking these medications, but given his duration of use he is at risk for benzodiazepine withdrawal.  Patient's mother is very helpful and involved and has agreed to monitor him for withdrawal.  Provided patient's mother with a paper prescription for a few tablets of Ativan, to be given only for signs or symptoms of withdrawal.  Patient advised to return the emergency department for any severe  symptoms, hallucinations, significant tremors, seizure, or recurrence of blackout spells.  Patient also advised not to drive or operate any heavy machinery until he recovers.  After the discussed management above, the patient was determined to be safe for discharge.  The patient was in agreement with this plan and all questions regarding their care were answered.  ED return precautions were discussed and the patient will return to the ED with any significant worsening of condition.  Elmer Sow. Pilar Plate, MD [facility] Emergency Medicine [facility] [email or web address]  Final Clinical Impressions(s) / ED Diagnoses     ICD-10-CM   1. Blackout spell R55   2. Chest pain R07.9 DG Chest 2 View    DG Chest 2 View    ED Discharge Orders         Ordered    LORazepam (ATIVAN) 0.5 MG tablet  Every 8 hours PRN     10/03/18 1851             Sabas Sous, MD 10/03/18 [phone]

## 2018-10-03 NOTE — Discharge Instructions (Addendum)
You were evaluated in the Emergency Department and after careful evaluation, we did not find any emergent condition requiring admission or further testing in the hospital.  Your symptoms today seem to be related to use of sleeping pills and Xanax.  A CT of your head today was normal, and so are your lab tests.  We agree with your plan to stop taking the sleeping pills and the Xanax.  Because you have been taking the Xanax every day for several weeks, you are at risk of withdrawing.  We have provided your mother with a few tablets of a similar medication, to be taken only for symptoms of withdrawal as discussed.  Please return to the Emergency Department if you experience any worsening of your condition.  We encourage you to follow up with a primary care provider.  Thank you for allowing [place] to be a part of your care.

## 2018-10-03 NOTE — ED Notes (Signed)
Gone to CT

## 2019-03-01 ENCOUNTER — Other Ambulatory Visit: Payer: Self-pay

## 2019-03-01 ENCOUNTER — Emergency Department (HOSPITAL_COMMUNITY): Payer: Self-pay

## 2019-03-01 ENCOUNTER — Emergency Department (HOSPITAL_COMMUNITY)
Admission: EM | Admit: 2019-03-01 | Discharge: 2019-03-01 | Disposition: A | Discharge status: Home / Self Care | Payer: Self-pay | Attending: Emergency Medicine | Admitting: Emergency Medicine

## 2019-03-01 ENCOUNTER — Encounter (HOSPITAL_COMMUNITY): Payer: Self-pay | Admitting: Emergency Medicine

## 2019-03-01 DIAGNOSIS — F17210 Nicotine dependence, cigarettes, uncomplicated: Secondary | ICD-10-CM | POA: Insufficient documentation

## 2019-03-01 DIAGNOSIS — Z79899 Other long term (current) drug therapy: Secondary | ICD-10-CM | POA: Insufficient documentation

## 2019-03-01 DIAGNOSIS — R0602 Shortness of breath: Secondary | ICD-10-CM | POA: Insufficient documentation

## 2019-03-01 DIAGNOSIS — R059 Cough, unspecified: Secondary | ICD-10-CM

## 2019-03-01 DIAGNOSIS — R05 Cough: Secondary | ICD-10-CM | POA: Insufficient documentation

## 2019-03-01 MED ORDER — PREDNISONE 20 MG PO TABS: 60.0000 mg | ORAL_TABLET | Freq: Every day | ORAL | 0 refills | Status: DC

## 2019-03-01 MED ORDER — IPRATROPIUM-ALBUTEROL 0.5-2.5 (3) MG/3ML IN SOLN
3.0000 mL | Freq: Once | RESPIRATORY_TRACT | Status: AC
  Administered 2019-03-01: 04:00:00 3 mL via RESPIRATORY_TRACT
  Filled 2019-03-01: qty 3

## 2019-03-01 NOTE — Discharge Instructions (Signed)
Start taking omeprazole (Prilosec OTC) once a day.  Take any over-the-counter cough medicine containing dextromethorphan as needed.  Stop smoking.

## 2019-03-01 NOTE — ED Provider Notes (Signed)
[facility] EMERGENCY DEPARTMENT Provider Note   CSN: [id number] Arrival date & time: 03/01/19  0304    History   Chief Complaint Chief Complaint  Patient presents with  . Shortness of Breath    HPI Jeffrey Wyatt is a 38 y.o. male.   The history is provided by the patient.  He complains of shortness of breath and a dry cough for the last month.  Symptoms are getting worse.  He does note that he gets more problems with cough and dyspnea when he lays flat, but also has dyspnea with exertion.  He denies fever or chills.  Denies arthralgias or myalgias.  Denies chest pain.  Denies exposure to anyone with COVID-19.  He does smoke more than a pack of cigarettes a day.  Past Medical History:  Diagnosis Date  . Tobacco use     Patient Active Problem List   Diagnosis Date Noted  . Mucosal abnormality of stomach   . Reflux esophagitis   . Dysphagia, pharyngoesophageal phase   . Esophageal dysphagia 04/09/2015    Past Surgical History:  Procedure Laterality Date  . ESOPHAGEAL DILATION N/A 04/10/2015   Procedure: ESOPHAGEAL DILATION;  Surgeon: Corbin Adeobert M Rourk, MD;  [facility];  Service: Endoscopy;  Laterality: N/A;  . ESOPHAGOGASTRODUODENOSCOPY N/A 04/10/2015   WUJ:WJXBJYNRMR:erosive reflux esophagitis/HH  . HAND SURGERY     left        Home Medications    Prior to Admission medications   Medication Sig Start Date End Date Taking? Authorizing Provider  diphenhydramine-acetaminophen (TYLENOL PM) 25-500 MG TABS tablet Take 8 tablets by mouth daily as needed (for sleep/pain).    [provider]  LORazepam (ATIVAN) 0.5 MG tablet Take 1 tablet (0.5 mg total) by mouth every 8 (eight) hours as needed (symptoms of withdrawal). 10/03/18   Sabas SousBero, Michael M, MD    Family History Family History  Problem Relation Age of Onset  . Colon cancer Neg Hx     Social History Social History   Tobacco Use  . Smoking status: Current Every Day Smoker    Packs/day: 2.00   Types: Cigarettes  . Smokeless tobacco: Never Used  Substance Use Topics  . Alcohol use: No  . Drug use: No     Allergies   Patient has no known allergies.   Review of Systems Review of Systems  All other systems reviewed and are negative.    Physical Exam Updated Vital Signs BP (!) 141/96   Pulse 75   Temp 98 F (36.7 C) (Oral)   Resp 17   Ht 6' (1.829 m)   Wt 77.1 kg   SpO2 100%   BMI 23.06 kg/m   Physical Exam Vitals signs and nursing note reviewed.    38 year old male, resting comfortably and in no acute distress. Vital signs are normal. Oxygen saturation is 100%, which is normal. Head is normocephalic and atraumatic. PERRLA, EOMI. Oropharynx is clear. Neck is nontender and supple without adenopathy or JVD. Back is nontender and there is no CVA tenderness. Lungs are clear without rales, wheezes, or rhonchi.  Slightly prolonged exhalation phase noted. Chest is nontender. Heart has regular rate and rhythm without murmur. Abdomen is soft, flat, nontender without masses or hepatosplenomegaly and peristalsis is normoactive. Extremities have no cyanosis or edema, full range of motion is present. Skin is warm and dry without rash. Neurologic: Mental status is normal, cranial nerves are intact, there are no motor or sensory deficits.  ED  Treatments / Results   Radiology Dg Chest Port 1 View  Result Date: 03/01/2019 CLINICAL DATA:  Dry cough and shortness of breath for 1 month EXAM: PORTABLE CHEST 1 VIEW COMPARISON:  10/03/2018 FINDINGS: Normal heart size and mediastinal contours. No acute infiltrate or edema. No effusion or pneumothorax. No acute osseous findings. IMPRESSION: Negative chest. Electronically Signed   By: Marnee Spring M.D.   On: 03/01/2019 04:33    Procedures Procedures   Medications Ordered in ED Medications  ipratropium-albuterol (DUONEB) 0.5-2.5 (3) MG/3ML nebulizer solution 3 mL (3 mLs Nebulization Given 03/01/19 0352)     Initial  Impression / Assessment and Plan / ED Course  I have reviewed the triage vital signs and the nursing notes.  Pertinent labs & imaging results that were available during my care of the patient were reviewed by me and considered in my medical decision making (see chart for details).  Cough and dyspnea of uncertain cause.  Old records were reviewed, and he had no relevant past visits.  Will check chest x-ray and give therapeutic trial of albuterol.  Also, GERD may be contributing to some of his symptoms.  Anticipate sending him home with a course of a proton pump inhibitor.  Chest x-ray is unremarkable.  He had no improvement with albuterol with ipratropium nebulizer treatment.  He is discharged with a prescription for a 5-day course of prednisone, advised to stop smoking, advised to start taking omeprazole daily.  Final Clinical Impressions(s) / ED Diagnoses   Final diagnoses:  Shortness of breath  Cough in adult patient    ED Discharge Orders         Ordered    predniSONE (DELTASONE) 20 MG tablet  Daily     03/01/19 0522           Dione Booze, MD 03/01/19 [phone]

## 2019-03-01 NOTE — ED Triage Notes (Signed)
Pt c/o sob for 1 month with dry cough.

## 2019-04-13 ENCOUNTER — Ambulatory Visit
Admission: EM | Admit: 2019-04-13 | Discharge: 2019-04-13 | Disposition: A | Discharge status: Home / Self Care | Payer: Self-pay | Attending: Emergency Medicine | Admitting: Emergency Medicine

## 2019-04-13 ENCOUNTER — Other Ambulatory Visit: Payer: Self-pay

## 2019-04-13 DIAGNOSIS — J209 Acute bronchitis, unspecified: Secondary | ICD-10-CM

## 2019-04-13 DIAGNOSIS — R053 Chronic cough: Secondary | ICD-10-CM

## 2019-04-13 DIAGNOSIS — R05 Cough: Secondary | ICD-10-CM

## 2019-04-13 DIAGNOSIS — Z20822 Contact with and (suspected) exposure to covid-19: Secondary | ICD-10-CM

## 2019-04-13 HISTORY — DX: Anxiety disorder, unspecified: F41.9

## 2019-04-13 MED ORDER — PREDNISONE 20 MG PO TABS: 20.0000 mg | ORAL_TABLET | Freq: Two times a day (BID) | ORAL | 0 refills | Status: AC

## 2019-04-13 MED ORDER — AZITHROMYCIN 250 MG PO TABS: 250.0000 mg | ORAL_TABLET | Freq: Every day | ORAL | 0 refills | Status: DC

## 2019-04-13 MED ORDER — ALBUTEROL SULFATE HFA 108 (90 BASE) MCG/ACT IN AERS
2.0000 | INHALATION_SPRAY | Freq: Once | RESPIRATORY_TRACT | Status: AC
  Administered 2019-04-13: 15:00:00 2 via RESPIRATORY_TRACT

## 2019-04-13 NOTE — Discharge Instructions (Addendum)
Declines chest x-ray today.  Would like to try outpatient therapy first.   Albuterol inhaler given in office, use as needed for shortness of breath, and/or wheezing Prednisone prescribed.  Take as directed and to completion Azithromycin prescribed.  Take as directed and to completion Get plenty of rest and push fluids  COVID testing ordered.  It takes 5-7 days for the test to result.  Someone will be in contact with you regarding your results at that time.   In the meantime: You should remain isolated in your home for 7 days from symptom onset AND greater than 72 hours after symptoms resolution (absence of fever without the use of fever-reducing medication and improvement in respiratory symptoms), whichever is longer.  May return to work sooner if you have a negative COVID test and are asymptomatic, or without symptoms.    Please follow up with a primary care provider in two weeks to establish care and reevaluate you to ensure your symptoms are improving.   Call or go to the ED if you have any new or worsening symptoms such as fever, worsening cough, shortness of breath, chest tightness, chest pain, turning blue, changes in mental status, etc..Marland Kitchen

## 2019-04-13 NOTE — ED Triage Notes (Signed)
Pt states he has had dry cough for past 2 months , denies fever

## 2019-04-13 NOTE — ED Provider Notes (Signed)
[facility]   [id number] 04/13/19 Arrival Time: 1432  Cc: COUGH  SUBJECTIVE:  Jeffrey Wyatt is a 38 y.o. male who presents with chronic cough x 2 months.  Denies sick exposure to COVID, flu or strep.  Denies recent travel.  Describes cough as intermittent and dry/ productive with white sputum.  Was seen in the ED on 03/01/2019 and had negative CXR.  Treated with prednisone without relief.  Was not tested for COVID at that time.  Denies aggravating factors.  Reports previous symptoms in the past that improved with inhaler.  Complains of intermittent wheezes.  Denies fever, chills, fatigue, sinus pain, rhinorrhea, sore throat, SOB, chest pain, chest pressure, abdominal pain, nausea, changes in bowel or bladder habits, calf pain/ swelling, recent surgery, recent long travel, hormone/ steroid use, previous blood clot.    Hx significant for tobacco use 1/2-1PPD x 30 yrs  ROS: As per HPI.  Past Medical History:  Diagnosis Date  . Anxiety   . Tobacco use    Past Surgical History:  Procedure Laterality Date  . ESOPHAGEAL DILATION N/A 04/10/2015   Procedure: ESOPHAGEAL DILATION;  Surgeon: Corbin Adeobert M Rourk, MD;  [facility];  Service: Endoscopy;  Laterality: N/A;  . ESOPHAGOGASTRODUODENOSCOPY N/A 04/10/2015   WUJ:WJXBJYNRMR:erosive reflux esophagitis/HH  . HAND SURGERY     left   No Known Allergies No current facility-administered medications on file prior to encounter.    Current Outpatient Medications on File Prior to Encounter  Medication Sig Dispense Refill  . diphenhydramine-acetaminophen (TYLENOL PM) 25-500 MG TABS tablet Take 8 tablets by mouth daily as needed (for sleep/pain).    . LORazepam (ATIVAN) 0.5 MG tablet Take 1 tablet (0.5 mg total) by mouth every 8 (eight) hours as needed (symptoms of withdrawal). 6 tablet 0    Social History   Socioeconomic History  . Marital status: Single    Spouse name: Not on file  . Number of children: 0  . Years of education:  Not on file  . Highest education level: Not on file  Occupational History  . Occupation: [employer]  Social Needs  . Financial resource strain: Not on file  . Food insecurity    Worry: Not on file    Inability: Not on file  . Transportation needs    Medical: Not on file    Non-medical: Not on file  Tobacco Use  . Smoking status: Current Every Day Smoker    Packs/day: 2.00    Types: Cigarettes  . Smokeless tobacco: Never Used  Substance and Sexual Activity  . Alcohol use: No  . Drug use: No  . Sexual activity: Not on file  Lifestyle  . Physical activity    Days per week: Not on file    Minutes per session: Not on file  . Stress: Not on file  Relationships  . Social Musicianconnections    Talks on phone: Not on file    Gets together: Not on file    Attends religious service: Not on file    Active member of club or organization: Not on file    Attends meetings of clubs or organizations: Not on file    Relationship status: Not on file  . Intimate partner violence    Fear of current or ex partner: Not on file    Emotionally abused: Not on file    Physically abused: Not on file    Forced sexual activity: Not on file  Other Topics Concern  . Not on  file  Social History Narrative  . Not on file   Family History  Problem Relation Age of Onset  . Colon cancer Neg Hx      OBJECTIVE:  Vitals:   04/13/19 1445  BP: 127/74  Pulse: 71  Resp: 18  Temp: 99 F (37.2 C)  SpO2: 96%     General appearance: Alert, well-appearing, nontoxic; speaking in full sentences without difficulty HEENT:NCAT; Ears: EACs clear, TMs pearly gray; Eyes: PERRL.  EOM grossly intact. Nose: nares patent without rhinorrhea; Throat: tonsils nonerythematous or enlarged, uvula midline  Neck: supple without LAD Lungs: scattered wheezes heard throughout; normal respiratory effort; mild cough present Heart: regular rate and rhythm.  Radial pulses 2+ symmetrical bilaterally Skin: warm and dry Psychological: alert  and cooperative; normal mood and affect   MDM:  Pt presenting with chronic cough x 2 months.  Denies COVID exposure.  Hx significant for smoking, 1/2-1 PPD x 30 years.  VS stable.  PE remarkable for scattered wheezes.  Pt declines CXR today.  Will treat for acute bronchitis with another course of prednisone and azithromycin, suspicious of chronic bronchitis exacerbation with significant smoking hx.  However, CXR done on 03/01/2019 in the ED did not show COPD changes.  Will also test for COVID.  Low suspicion for PE given length of symptoms, and no risk factors other than tobacco use.  Also discussed possibility of acid reflux or PND contributing to chronic cough.  Suggested patient trial a week of antihistamine and/or a week of OTC omeprazole if symptoms did not improved with prescribed medications.  Recommended follow up with PCP to establish care and for further work-up if cough is persisting despite treatment.  PCP information given.  Given strict return and ED precautions.    ASSESSMENT & PLAN:  1. Acute bronchitis, unspecified organism   2. Chronic cough   3. Suspected Covid-19 Virus Infection      Meds ordered this encounter  Medications  . predniSONE (DELTASONE) 20 MG tablet    Sig: Take 1 tablet (20 mg total) by mouth 2 (two) times daily with a meal for 5 days.    Dispense:  10 tablet    Refill:  0    Order Specific Question:   Supervising Provider    Answer:   Raylene Everts [[id number]]  . azithromycin (ZITHROMAX) 250 MG tablet    Sig: Take 1 tablet (250 mg total) by mouth daily. Take first 2 tablets together, then 1 every day until finished.    Dispense:  6 tablet    Refill:  0    Order Specific Question:   Supervising Provider    Answer:   Raylene Everts [[id number]]  . albuterol (VENTOLIN HFA) 108 (90 Base) MCG/ACT inhaler 2 puff    Orders Placed This Encounter  Procedures  . Novel Coronavirus, NAA ([facility])    Standing Status:   Standing    Number of Occurrences:   1     Declines chest x-ray today.  Would like to try outpatient therapy first.   Albuterol inhaler given in office, use as needed for shortness of breath, and/or wheezing Prednisone prescribed.  Take as directed and to completion Azithromycin prescribed.  Take as directed and to completion Get plenty of rest and push fluids  COVID testing ordered.  It takes 5-7 days for the test to result.  Someone will be in contact with you regarding your results at that time.   In the meantime: You should remain isolated in  your home for 7 days from symptom onset AND greater than 72 hours after symptoms resolution (absence of fever without the use of fever-reducing medication and improvement in respiratory symptoms), whichever is longer.  May return to work sooner if you have a negative COVID test and are asymptomatic, or without symptoms.    Please follow up with a primary care provider in two weeks to establish care and reevaluate you to ensure your symptoms are improving.   Call or go to the ED if you have any new or worsening symptoms such as fever, worsening cough, shortness of breath, chest tightness, chest pain, turning blue, changes in mental status, etc...  Reviewed expectations re: course of current medical issues. Questions answered. Outlined signs and symptoms indicating need for more acute intervention. Patient verbalized understanding. After Visit Summary given.          Rennis HardingWurst, Hinda Lindor, PA-C 04/13/19 1538

## 2019-04-15 LAB — NOVEL CORONAVIRUS, NAA: SARS-CoV-2, NAA: NOT DETECTED

## 2019-04-23 ENCOUNTER — Telehealth: Payer: Self-pay

## 2019-04-23 NOTE — Telephone Encounter (Signed)
Pt called requesting test results. COVID-19 test is negative, results went over with patient. All questions answered. Work note printed for patient to pick up from office. Pt verbalized understanding.

## 2019-04-24 ENCOUNTER — Ambulatory Visit
Admission: EM | Admit: 2019-04-24 | Discharge: 2019-04-24 | Disposition: A | Discharge status: Home / Self Care | Payer: Self-pay | Attending: Emergency Medicine | Admitting: Emergency Medicine

## 2019-04-24 ENCOUNTER — Telehealth: Payer: Self-pay | Admitting: Emergency Medicine

## 2019-04-24 ENCOUNTER — Ambulatory Visit (INDEPENDENT_AMBULATORY_CARE_PROVIDER_SITE_OTHER): Payer: Self-pay

## 2019-04-24 ENCOUNTER — Other Ambulatory Visit: Payer: Self-pay

## 2019-04-24 DIAGNOSIS — J189 Pneumonia, unspecified organism: Secondary | ICD-10-CM

## 2019-04-24 DIAGNOSIS — R053 Chronic cough: Secondary | ICD-10-CM

## 2019-04-24 DIAGNOSIS — R05 Cough: Secondary | ICD-10-CM

## 2019-04-24 DIAGNOSIS — T7840XA Allergy, unspecified, initial encounter: Secondary | ICD-10-CM

## 2019-04-24 DIAGNOSIS — K219 Gastro-esophageal reflux disease without esophagitis: Secondary | ICD-10-CM

## 2019-04-24 MED ORDER — OMEPRAZOLE 20 MG PO CPDR: 20.0000 mg | DELAYED_RELEASE_CAPSULE | Freq: Every day | ORAL | 0 refills | Status: DC

## 2019-04-24 MED ORDER — LEVOFLOXACIN 750 MG PO TABS: 750.0000 mg | ORAL_TABLET | Freq: Every day | ORAL | 0 refills | Status: AC

## 2019-04-24 MED ORDER — CETIRIZINE HCL 10 MG PO CHEW: 10.0000 mg | CHEWABLE_TABLET | Freq: Every day | ORAL | 0 refills | Status: DC

## 2019-04-24 MED ORDER — ALBUTEROL SULFATE HFA 108 (90 BASE) MCG/ACT IN AERS
2.0000 | INHALATION_SPRAY | Freq: Once | RESPIRATORY_TRACT | Status: AC
  Administered 2019-04-24: 11:00:00 2 via RESPIRATORY_TRACT

## 2019-04-24 NOTE — Discharge Instructions (Addendum)
X-ray did not show cardiopulmonary disease.  Pending radiologist interpretation.  I will follow up with you regarding abnormal results Inhaler given in office Get plenty of rest and push fluids Continue with antihistamine and omeprazole daily Prescriptions given for both Follow up with PCP for further evaluation and management of chronic cough Return or go to ER if you have any new or worsening symptoms such as fever, chills, fatigue, shortness of breath, wheezing, chest pain, nausea, changes in bowel or bladder habits, etc..Marland Kitchen

## 2019-04-24 NOTE — ED Triage Notes (Signed)
Pt is here to reevaluate cough that is not improving

## 2019-04-24 NOTE — ED Provider Notes (Signed)
[place]   [id number] 04/24/19 Arrival Time: 61  Cc: Chronic COUGH  SUBJECTIVE:  Jeffrey Wyatt is a 38 y.o. male who presents with chronic cough > 2 months.  Denies sick exposure to COVID, flu or strep.  Denies recent travel.  Describes cough as intermittent and dry/ productive with white sputum. Was seen on 04/13/2019 for the same problem.  Treated with azithromycin, prednisone, and inhaler without relief.  COVID test negative.  Also tried using antihistamine and omeprazole daily without relief.  Denies aggravating factors.  Reports previous symptoms in the past that improved with inhaler.   Denies fever, chills, fatigue, congestion, rhinorrhea, sore throat, PND, acid reflux, SOB, wheezing, chest pain, nausea, vomiting, abdominal pain, changes in bowel or bladder habits.    Hx significant for smoking 1.5 PPD x 30 years.    ROS: As per HPI.  All other pertinent ROS negative.     Past Medical History:  Diagnosis Date  . Anxiety   . Tobacco use    Past Surgical History:  Procedure Laterality Date  . ESOPHAGEAL DILATION N/A 04/10/2015   Procedure: ESOPHAGEAL DILATION;  Surgeon: Daneil Dolin, MD;  Location: [facility];  Service: Endoscopy;  Laterality: N/A;  . ESOPHAGOGASTRODUODENOSCOPY N/A 04/10/2015   PFX:[id number] reflux esophagitis/HH  . HAND SURGERY     left   No Known Allergies No current facility-administered medications on file prior to encounter.    Current Outpatient Medications on File Prior to Encounter  Medication Sig Dispense Refill  . diphenhydramine-acetaminophen (TYLENOL PM) 25-500 MG TABS tablet Take 8 tablets by mouth daily as needed (for sleep/pain).    . LORazepam (ATIVAN) 0.5 MG tablet Take 1 tablet (0.5 mg total) by mouth every 8 (eight) hours as needed (symptoms of withdrawal). 6 tablet 0    Social History   Socioeconomic History  . Marital status: Single    Spouse name: Not on file  . Number of children: 0  . Years of education:  Not on file  . Highest education level: Not on file  Occupational History  . Occupation: [employer]  Social Needs  . Financial resource strain: Not on file  . Food insecurity    Worry: Not on file    Inability: Not on file  . Transportation needs    Medical: Not on file    Non-medical: Not on file  Tobacco Use  . Smoking status: Current Every Day Smoker    Packs/day: 2.00    Types: Cigarettes  . Smokeless tobacco: Never Used  Substance and Sexual Activity  . Alcohol use: No  . Drug use: No  . Sexual activity: Not on file  Lifestyle  . Physical activity    Days per week: Not on file    Minutes per session: Not on file  . Stress: Not on file  Relationships  . Social Herbalist on phone: Not on file    Gets together: Not on file    Attends religious service: Not on file    Active member of club or organization: Not on file    Attends meetings of clubs or organizations: Not on file    Relationship status: Not on file  . Intimate partner violence    Fear of current or ex partner: Not on file    Emotionally abused: Not on file    Physically abused: Not on file    Forced sexual activity: Not on file  Other Topics Concern  . Not on  file  Social History Narrative  . Not on file   Family History  Problem Relation Age of Onset  . Colon cancer Neg Hx      OBJECTIVE:  Vitals:   04/24/19 1048  BP: 132/72  Pulse: 78  Resp: 18  Temp: 98.2 F (36.8 C)  SpO2: 98%     General appearance: Alert, appears mildly fatigued, but nontoxic; speaking in full sentences without difficulty HEENT:NCAT; Ears: EACs clear, TMs pearly gray; Eyes: PERRL.  EOM grossly intact. Nose: nares patent without rhinorrhea; Throat: tonsils nonerythematous or enlarged, uvula midline  Neck: supple without LAD Lungs: clear to auscultation bilaterally without adventitious breath sounds; normal respiratory effort; mild cough present Heart: regular rate and rhythm.  Skin: warm and dry  Psychological: alert and cooperative; normal mood and affect  DIAGNOSTIC STUDIES:  Dg Chest 2 View  Result Date: 04/24/2019 CLINICAL DATA:  Cough for 2 months EXAM: CHEST - 2 VIEW COMPARISON:  Cyst or 2000 FINDINGS: The heart size and mediastinal contours are within normal limits. Very prominent pulmonary vasculature and interstitium. There may be occasional small nodules in the apices. The visualized skeletal structures are unremarkable. IMPRESSION: Very prominent pulmonary vasculature and interstitium. There may be occasional small nodules in the apices. Findings are suggestive of atypical infection. There is no focal airspace opacity. Consider CT to further evaluate given stated history of persistent cough. Electronically Signed   By: Lauralyn PrimesAlex  Bibbey M.D.   On: 04/24/2019 11:30    My interpretation:  X-rays without obvious infiltrate, pleural effusion, or cardiomegaly.  Appears stable compared to past CXR.   I have reviewed the x-rays myself and the radiologist interpretation. After reevaluation of chest x-ray I am in agreement with radiologist interpretation.    ASSESSMENT & PLAN:  1. Chronic cough   2. Gastroesophageal reflux disease, esophagitis presence not specified   3. Allergic state, initial encounter     Meds ordered this encounter  Medications  . albuterol (VENTOLIN HFA) 108 (90 Base) MCG/ACT inhaler 2 puff  . cetirizine (ZYRTEC) 10 MG chewable tablet    Sig: Chew 1 tablet (10 mg total) by mouth daily.    Dispense:  20 tablet    Refill:  0    Order Specific Question:   Supervising Provider    Answer:   Eustace MooreNELSON, YVONNE SUE [[id number]]  . omeprazole (PRILOSEC) 20 MG capsule    Sig: Take 1 capsule (20 mg total) by mouth daily.    Dispense:  20 capsule    Refill:  0    Order Specific Question:   Supervising Provider    Answer:   Eustace MooreELSON, YVONNE SUE [[id number]]    Orders Placed This Encounter  Procedures  . DG Chest 2 View    Standing Status:   Standing    Number of  Occurrences:   1    Order Specific Question:   Reason for Exam (SYMPTOM  OR DIAGNOSIS REQUIRED)    Answer:   cough    X-ray did not show cardiopulmonary disease.  Pending radiologist interpretation.  I will follow up with you regarding abnormal results Inhaler given in office Get plenty of rest and push fluids Continue with antihistamine and omeprazole daily Prescriptions given for both Follow up with PCP for further evaluation and management of chronic cough Return or go to ER if you have any new or worsening symptoms such as fever, chills, fatigue, shortness of breath, wheezing, chest pain, nausea, changes in bowel or bladder habits, etc...  Reviewed expectations  re: course of current medical issues. Questions answered. Outlined signs and symptoms indicating need for more acute intervention. Patient verbalized understanding. After Visit Summary given.  ADDENDUM:  Radiologist mentions concern for atypical pneumonia. Recommended CT scan given CXR and length of symptoms. Will call and notify patient. Attempted to call patient x 2.  Patient did not answer, and voice mail does not verify it is the patient.  No voicemail left.  Will start on levofloxacin 750 mg daily x 5 days.           Rennis HardingWurst, Kory Rains, PA-C 04/24/19 1455

## 2019-04-24 NOTE — Telephone Encounter (Signed)
Contacted patient and notified him of the chest x-ray results.  Sent in prescription for levofloxacin to cover for atypical pneumonia.  Radiologist also recommended CT scan based on changes from previous chest x-ray from 03/01/2019 with symptom of chronic cough >2 months.  Of note patients hx significant for tobacco use, smokes 1.5 PPD x 30 years.  At this time, we are unable to order a CT scan due to limitations of urgent care.  Called Dr. Lanice Shirts office to set up an appointment to establish care and for further evaluation and management.  Dr. Lanice Shirts office will have a practitioner contact the patient by Monday next week for pre-screening and to set up an appointment.  Patient will come into Urgent Care tomorrow (04/25/2019) to sign medical release so we may send over chest x-ray results/ records to Dr. Lanice Shirts office.  Patient made aware and in agreement with this plan.  All questions answered.

## 2019-04-24 NOTE — Telephone Encounter (Signed)
Radiologist mentions concern for atypical pneumonia.  Recommended CT scan given CXR and length of symptoms.  Will call and notify patient.  Attempted to call patient x 2.  Patient did not answer, and voice mail does not verify it is the patient.  No voicemail left.  Will start on levofloxacin 750 mg daily x 5 days.

## 2019-04-27 ENCOUNTER — Telehealth: Payer: Self-pay | Admitting: Emergency Medicine

## 2019-04-27 NOTE — Telephone Encounter (Signed)
Spoke with patient.  Unable to get in with Dr. Anastasio Champion for chronic cough.  I called [facility] in [place], [place].  Appt made for Aug. 12 at 4 pm with Dr. Holly Bodily.  Notified patient, he is aware and in agreement with this plan.  Will follow up with Dr. Holly Bodily for further evaluation and management of chronic cough.

## 2019-05-09 ENCOUNTER — Other Ambulatory Visit: Payer: Self-pay

## 2019-05-09 ENCOUNTER — Ambulatory Visit (INDEPENDENT_AMBULATORY_CARE_PROVIDER_SITE_OTHER): Payer: Self-pay | Admitting: Family Medicine

## 2019-05-09 ENCOUNTER — Encounter: Payer: Self-pay | Admitting: Family Medicine

## 2019-05-09 VITALS — BP 124/84 | HR 85 | Temp 98.5°F | Ht 73.0 in | Wt 171.6 lb

## 2019-05-09 DIAGNOSIS — R1314 Dysphagia, pharyngoesophageal phase: Secondary | ICD-10-CM

## 2019-05-09 DIAGNOSIS — R0789 Other chest pain: Secondary | ICD-10-CM | POA: Insufficient documentation

## 2019-05-09 DIAGNOSIS — K2100 Gastro-esophageal reflux disease with esophagitis, without bleeding: Secondary | ICD-10-CM

## 2019-05-09 DIAGNOSIS — R9389 Abnormal findings on diagnostic imaging of other specified body structures: Secondary | ICD-10-CM | POA: Insufficient documentation

## 2019-05-09 MED ORDER — BENZONATATE 200 MG PO CAPS: 200.0000 mg | ORAL_CAPSULE | Freq: Two times a day (BID) | ORAL | 0 refills | Status: DC | PRN

## 2019-05-09 MED ORDER — ALBUTEROL SULFATE HFA 108 (90 BASE) MCG/ACT IN AERS: 2.0000 | INHALATION_SPRAY | Freq: Four times a day (QID) | RESPIRATORY_TRACT | 1 refills | Status: DC | PRN

## 2019-05-09 MED ORDER — OMEPRAZOLE 40 MG PO CPDR: 40.0000 mg | DELAYED_RELEASE_CAPSULE | Freq: Every day | ORAL | 3 refills | Status: DC

## 2019-05-09 NOTE — Patient Instructions (Addendum)
Schedule a CT as recommended by radiology after abnormal cxr Trial of omeprazole for reflux Trial of tessalon perles for cough Trial of albuterol for bronchospasm Ok to continue zyrtec as needed Blood work tomorrow cbc,cmp

## 2019-05-09 NOTE — Progress Notes (Signed)
New Patient Office Visit  Subjective:  Patient ID: Jeffrey Wyatt, male    DOB: 08-28-1981  Age: 38 y.o. MRN: [id number]  CC:  Chief Complaint  Patient presents with  . New Patient (Initial Visit)  . Cough    times 2 months     HPI Jeffrey Wyatt presents for cough 04-24-19 chronic cough > 2 months.  Denies sick exposure to COVID, flu or strep.  Denies recent travel.  Describes cough as intermittent and dry/ productive with white sputum. Was seen on 04/13/2019 for the same problem.  Treated with azithromycin, prednisone, and inhaler without relief.  COVID test negative.  Also tried using antihistamine and omeprazole daily without relief.  Denies aggravating factors.  Reports previous symptoms in the past that improved with inhaler.   Denies fever, chills, fatigue, congestion, rhinorrhea, sore throat, PND, acid reflux, SOB, wheezing, chest pain, nausea, vomiting, abdominal pain, changes in bowel or bladder habits.    Very prominent pulmonary vasculature and interstitium. There may be occasional small nodules in the apices. Findings are suggestive of atypical infection. There is no focal airspace opacity. Consider CT to further evaluate given stated history of persistent cough.  Pt completed Levofloxacin 750mg  x 5days-no change in cough after medication.    No allergy testing No COPD mucinex DM, zyrtec Past Medical History:  Diagnosis Date  . Allergy   . Anxiety   . Tobacco use     Past Surgical History:  Procedure Laterality Date  . ESOPHAGEAL DILATION N/A 04/10/2015   Procedure: ESOPHAGEAL DILATION;  Surgeon: Daneil Dolin, MD;  [facility]: [facility];  Service: Endoscopy;  Laterality: N/A;  . ESOPHAGOGASTRODUODENOSCOPY N/A 04/10/2015   HYQ:MVHQION reflux esophagitis/HH  . HAND SURGERY     left    Family History  Problem Relation Age of Onset  . Colon cancer Neg Hx     Social History   Socioeconomic History  . Marital status: Single    Spouse name:  Not on file  . Number of children: 0  . Years of education: Not on file  . Highest education level: Not on file  Occupational History  . Occupation: [employer]  Social Needs  . Financial resource strain: Not on file  . Food insecurity    Worry: Not on file    Inability: Not on file  . Transportation needs    Medical: Not on file    Non-medical: Not on file  Tobacco Use  . Smoking status: Current Every Day Smoker    Packs/day: 1.50    Years: 28.00    Pack years: 42.00    Types: Cigarettes  . Smokeless tobacco: Never Used  Substance and Sexual Activity  . Alcohol use: Not on file  . Drug use: Not on file  . Sexual activity: Not Currently  Lifestyle  . Physical activity    Days per week: Not on file    Minutes per session: Not on file  . Stress: Not on file  Relationships  . Social Herbalist on phone: Not on file    Gets together: Not on file    Attends religious service: Not on file    Active member of club or organization: Not on file    Attends meetings of clubs or organizations: Not on file    Relationship status: Not on file  . Intimate partner violence    Fear of current or ex partner: Not on file    Emotionally abused: Not  on file    Physically abused: Not on file    Forced sexual activity: Not on file  Other Topics Concern  . Not on file  Social History Narrative  . Not on file    ROS Review of Systems  Constitutional: Negative for fatigue and fever.  HENT: Positive for trouble swallowing.   Respiratory: Positive for cough, chest tightness, shortness of breath and wheezing.   Gastrointestinal: Negative for abdominal pain.  GERD-esophageal dilation  Objective:   Today's Vitals: BP 124/84 (BP Location: Right Arm, Patient Position: Sitting, Cuff Size: Normal)   Pulse 85   Temp 98.5 F (36.9 C) (Oral)   Ht 6\' 1"  (1.854 m)   Wt 171 lb 9.6 oz (77.8 kg)   SpO2 97%   BMI 22.64 kg/m   Physical Exam Constitutional:      Appearance: Normal  appearance.  HENT:     Head: Normocephalic and atraumatic.     Nose: Nose normal.  Eyes:     Conjunctiva/sclera: Conjunctivae normal.  Neck:     Musculoskeletal: Normal range of motion.  Cardiovascular:     Rate and Rhythm: Normal rate and regular rhythm.     Pulses: Normal pulses.     Heart sounds: Normal heart sounds.  Pulmonary:     Effort: Pulmonary effort is normal.     Breath sounds: Normal breath sounds.  Neurological:     Mental Status: He is alert.     Assessment & Plan:    Outpatient Encounter Medications as of 05/09/2019  Medication Sig  . cetirizine (ZYRTEC) 10 MG chewable tablet Chew 1 tablet (10 mg total) by mouth daily.  . diphenhydramine-acetaminophen (TYLENOL PM) 25-500 MG TABS tablet Take 8 tablets by mouth daily as needed (for sleep/pain).  . [DISCONTINUED] LORazepam (ATIVAN) 0.5 MG tablet Take 1 tablet (0.5 mg total) by mouth every 8 (eight) hours as needed (symptoms of withdrawal). (Patient not taking: Reported on 05/09/2019)  . [DISCONTINUED] omeprazole (PRILOSEC) 20 MG capsule Take 1 capsule (20 mg total) by mouth daily. (Patient not taking: Reported on 05/09/2019)   No facility-administered encounter medications on file as of 05/09/2019.    1. Other chest pain Note for work 8/12-8/14 - CT Chest Wo Contrast; Future SOB/Cough/tessalon/albuterol-rx 2. Abnormal chest x-ray CT lung recommended  3. Reflux esophagitis omprazole-rx Cbc, cmp 4. Dysphagia, pharyngoesophageal phase Concern for reflux Follow-up:    Mat CarneLEIGH , MD

## 2019-05-15 ENCOUNTER — Ambulatory Visit (HOSPITAL_COMMUNITY)
Admission: RE | Admit: 2019-05-15 | Discharge: 2019-05-15 | Disposition: A | Discharge status: Home / Self Care | Payer: Self-pay | Source: Ambulatory Visit | Attending: Family Medicine | Admitting: Family Medicine

## 2019-05-15 ENCOUNTER — Other Ambulatory Visit: Payer: Self-pay

## 2019-05-15 DIAGNOSIS — R0789 Other chest pain: Secondary | ICD-10-CM | POA: Insufficient documentation

## 2019-05-16 ENCOUNTER — Other Ambulatory Visit: Payer: Self-pay | Admitting: Family Medicine

## 2019-05-16 ENCOUNTER — Encounter: Payer: Self-pay | Admitting: Family Medicine

## 2019-05-16 DIAGNOSIS — R918 Other nonspecific abnormal finding of lung field: Secondary | ICD-10-CM

## 2019-05-16 NOTE — Progress Notes (Signed)
Ct

## 2019-05-22 ENCOUNTER — Telehealth: Payer: Self-pay | Admitting: *Deleted

## 2019-05-22 NOTE — Telephone Encounter (Signed)
Routing to Dr. Corum 

## 2019-05-22 NOTE — Telephone Encounter (Signed)
Pt called wanting to know his CT results

## 2019-05-24 ENCOUNTER — Other Ambulatory Visit: Payer: Self-pay | Admitting: Family Medicine

## 2019-05-24 ENCOUNTER — Telehealth: Payer: Self-pay

## 2019-05-24 DIAGNOSIS — R059 Cough, unspecified: Secondary | ICD-10-CM

## 2019-05-24 DIAGNOSIS — R05 Cough: Secondary | ICD-10-CM

## 2019-05-24 MED ORDER — BENZONATATE 200 MG PO CAPS: 200.0000 mg | ORAL_CAPSULE | Freq: Two times a day (BID) | ORAL | 0 refills | Status: DC | PRN

## 2019-05-24 NOTE — Telephone Encounter (Signed)
Referral has been made to [facility]

## 2019-05-24 NOTE — Telephone Encounter (Signed)
Patient is aware of results.

## 2019-05-24 NOTE — Telephone Encounter (Signed)
Mr Sones is aware of his CT Results and he is asking if we could please refill the Tessalon for cough at this time he is out and still coughing, Please advise?

## 2019-05-24 NOTE — Telephone Encounter (Signed)
Allergy and asthma

## 2019-06-12 ENCOUNTER — Ambulatory Visit (INDEPENDENT_AMBULATORY_CARE_PROVIDER_SITE_OTHER): Payer: Self-pay | Admitting: Internal Medicine

## 2019-07-11 ENCOUNTER — Ambulatory Visit: Payer: Self-pay | Admitting: Allergy & Immunology

## 2020-06-04 IMAGING — DX CHEST - 2 VIEW
2 series · 2 of 2 positions shown · non-contrast
Comparison: Cyst or 3777

CLINICAL DATA: Cough for 2 months

EXAM:
CHEST - 2 VIEW

[chest pa]
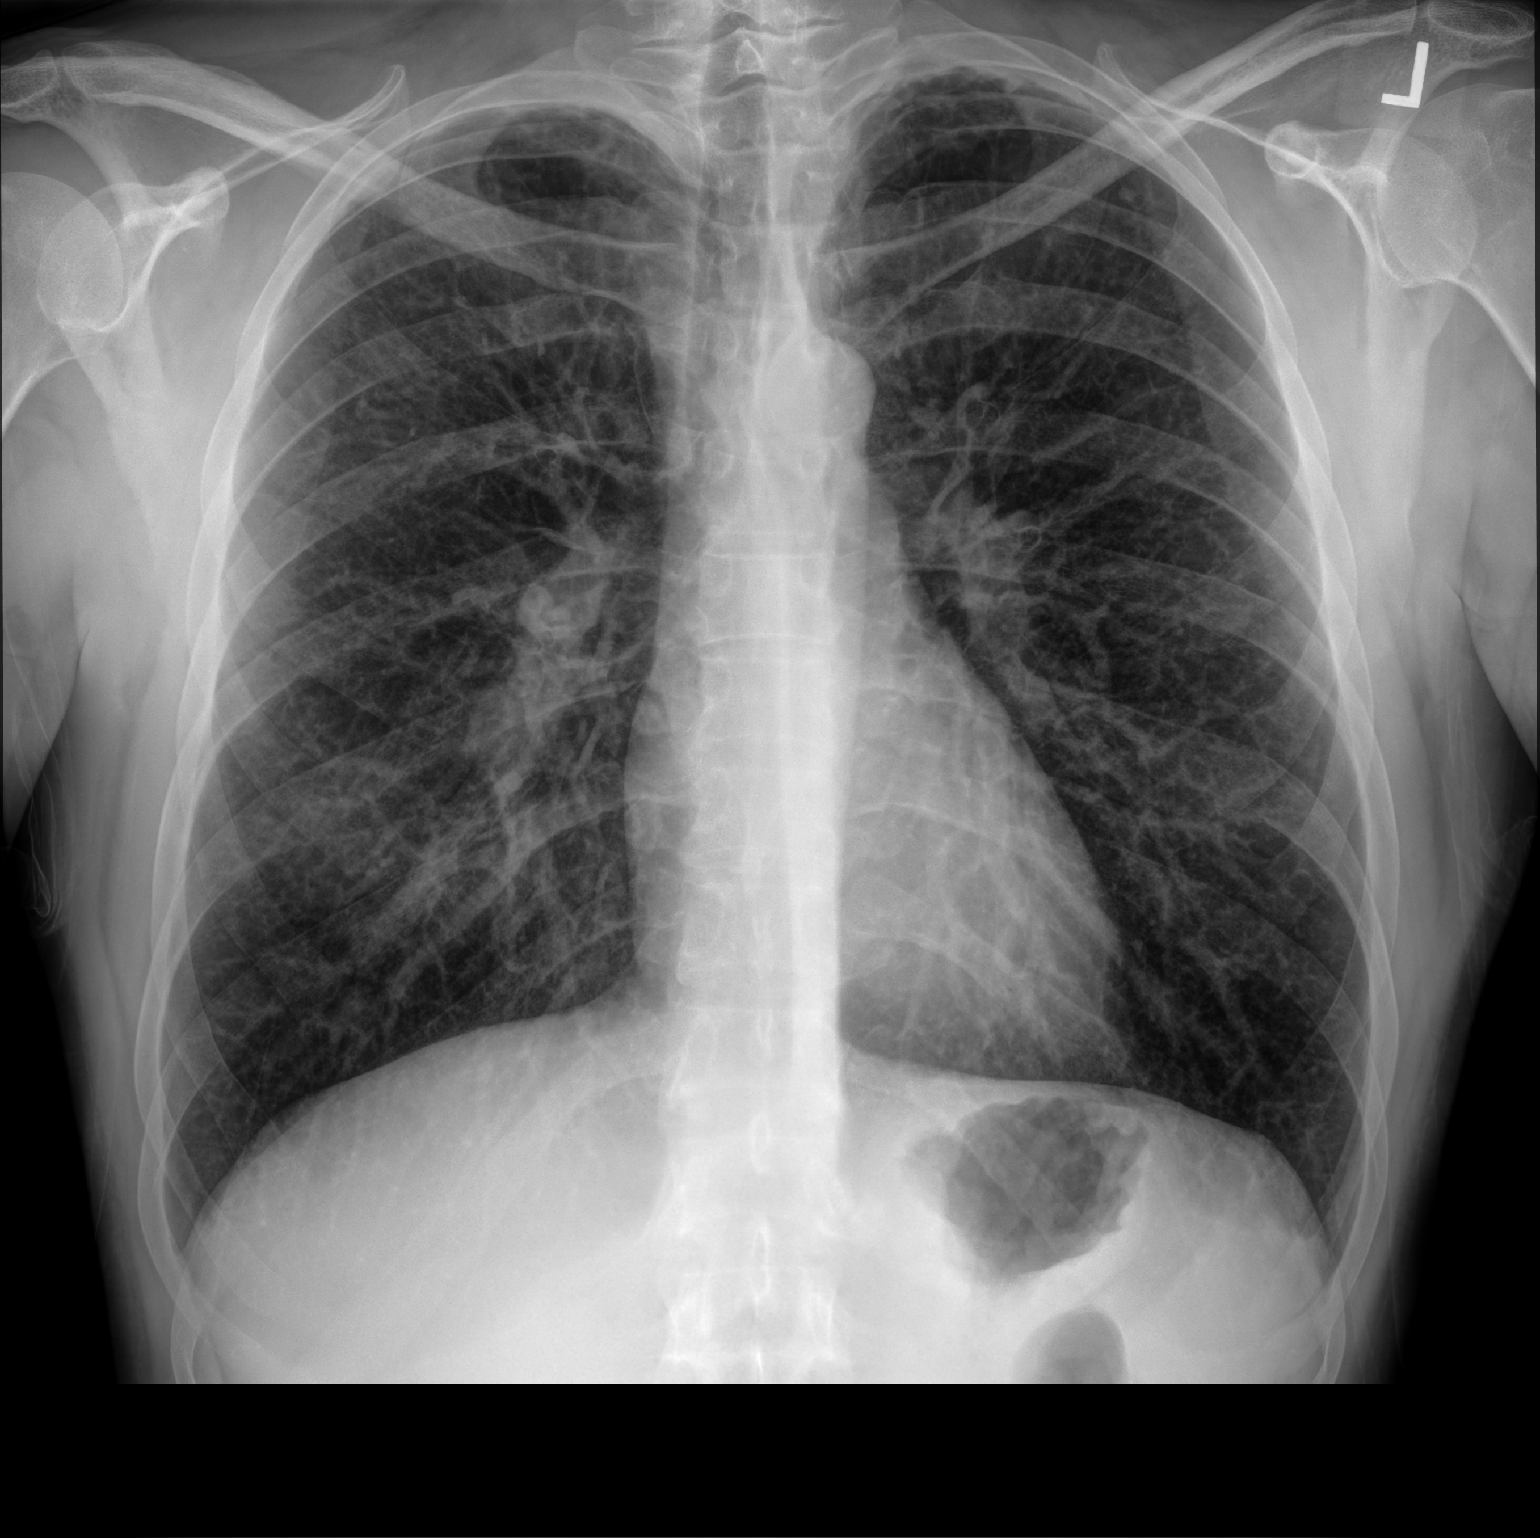

[chest lat]
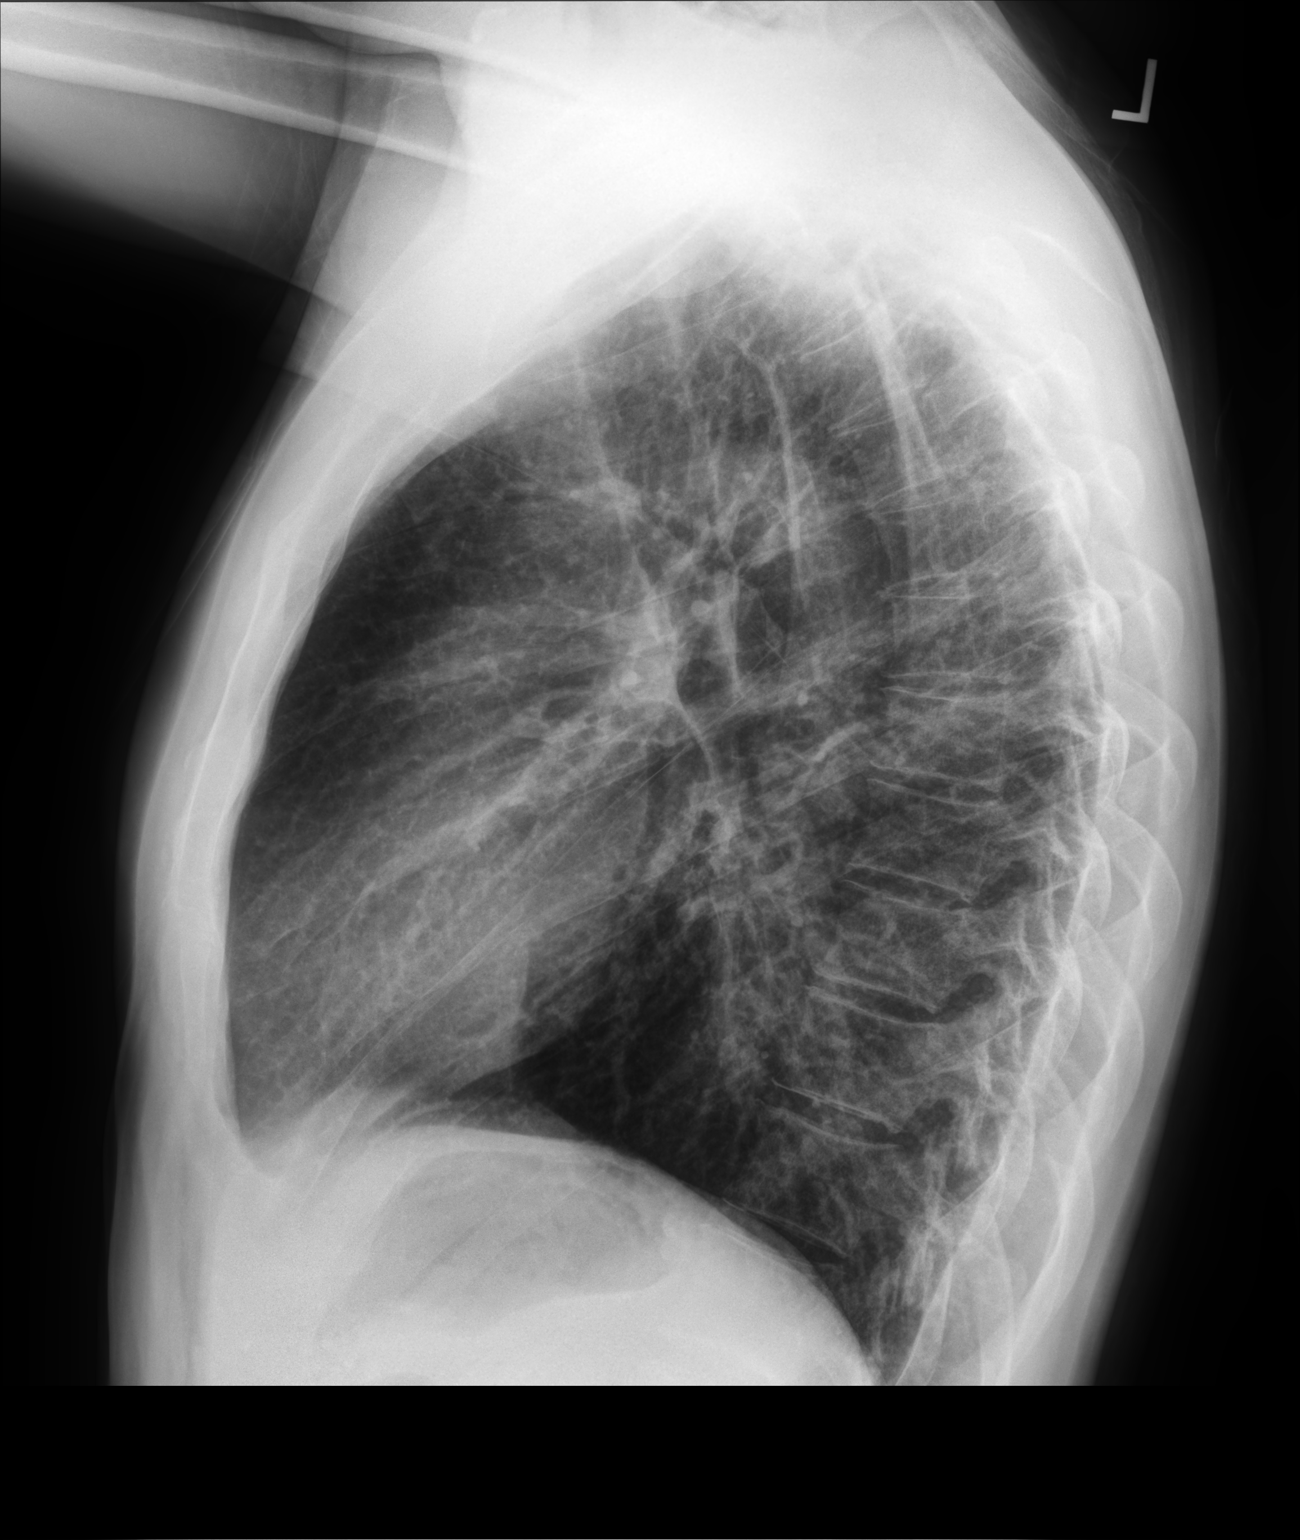

[2 of 2 positions shown; findings below may reference images not displayed]

FINDINGS: The heart size and mediastinal contours are within normal limits.
Very prominent pulmonary vasculature and interstitium. There may be
occasional small nodules in the apices. The visualized skeletal
structures are unremarkable.
IMPRESSION: Very prominent pulmonary vasculature and interstitium. There may be
occasional small nodules in the apices. Findings are suggestive of
atypical infection. There is no focal airspace opacity. Consider CT
to further evaluate given stated history of persistent cough.

## 2020-09-27 HISTORY — PX: MANDIBLE SURGERY: SHX707

## 2020-10-15 ENCOUNTER — Emergency Department (HOSPITAL_COMMUNITY)
Admission: EM | Admit: 2020-10-15 | Discharge: 2020-10-15 | Disposition: A | Discharge status: Home / Self Care | Payer: Self-pay | Attending: Emergency Medicine | Admitting: Emergency Medicine

## 2020-10-15 ENCOUNTER — Encounter (HOSPITAL_COMMUNITY): Payer: Self-pay

## 2020-10-15 ENCOUNTER — Other Ambulatory Visit: Payer: Self-pay

## 2020-10-15 ENCOUNTER — Emergency Department (HOSPITAL_COMMUNITY): Payer: Self-pay

## 2020-10-15 DIAGNOSIS — Z20822 Contact with and (suspected) exposure to covid-19: Secondary | ICD-10-CM | POA: Insufficient documentation

## 2020-10-15 DIAGNOSIS — F17210 Nicotine dependence, cigarettes, uncomplicated: Secondary | ICD-10-CM | POA: Insufficient documentation

## 2020-10-15 DIAGNOSIS — J40 Bronchitis, not specified as acute or chronic: Secondary | ICD-10-CM | POA: Insufficient documentation

## 2020-10-15 LAB — BASIC METABOLIC PANEL
Anion gap: 10 (ref 5–15)
BUN: 9 mg/dL (ref 6–20)
CO2: 25 mmol/L (ref 22–32)
Calcium: 9.5 mg/dL (ref 8.9–10.3)
Chloride: 106 mmol/L (ref 98–111)
Creatinine, Ser: 0.76 mg/dL (ref 0.61–1.24)
GFR, Estimated: >60 mL/min (ref >60)
Glucose, Bld: 98 mg/dL (ref 70–99)
Potassium: 3.8 mmol/L (ref 3.5–5.1)
Sodium: 141 mmol/L (ref 135–145)

## 2020-10-15 LAB — CBC
HCT: 41.7 % (ref 39.0–52.0)
Hemoglobin: 14 g/dL (ref 13.0–17.0)
MCH: 31.1 pg (ref 26.0–34.0)
MCHC: 33.6 g/dL (ref 30.0–36.0)
MCV: 92.7 fL (ref 80.0–100.0)
Platelets: 304 10*3/uL (ref 150–400)
RBC: 4.5 MIL/uL (ref 4.22–5.81)
RDW: 13.1 % (ref 11.5–15.5)
WBC: 9.3 10*3/uL (ref 4.0–10.5)
nRBC: 0 % (ref 0.0–0.2)

## 2020-10-15 MED ORDER — ALBUTEROL SULFATE HFA 108 (90 BASE) MCG/ACT IN AERS
4.0000 | INHALATION_SPRAY | Freq: Once | RESPIRATORY_TRACT | Status: AC
  Administered 2020-10-15: 4 via RESPIRATORY_TRACT
  Filled 2020-10-15: qty 6.7

## 2020-10-15 MED ORDER — IPRATROPIUM BROMIDE HFA 17 MCG/ACT IN AERS
2.0000 | INHALATION_SPRAY | Freq: Once | RESPIRATORY_TRACT | Status: AC
  Administered 2020-10-15: 2 via RESPIRATORY_TRACT
  Filled 2020-10-15: qty 12.9

## 2020-10-15 MED ORDER — FLUTICASONE PROPIONATE 50 MCG/ACT NA SUSP: 2.0000 | Freq: Every day | NASAL | 0 refills | Status: DC

## 2020-10-15 NOTE — Discharge Instructions (Addendum)
You are seen today for shortness of breath and cough, I think you most likely have bronchitis. Your work-up today was reassuring. Your COVID test is pending, you will get this in the next 6 to 24 hours. If it is positive please follow CDC guidelines and isolate. If you have any new or worsening concerning symptoms please come back to the emergency department including worsening shortness of breath, difficulty breathing, chest pain. Please use the attached instructions. I want you to also use the albuterol inhaler 2 puffs every 6 hours as needed, you can also use the Atrovent 2 puffs at night when your symptoms worsen. You can use this for the next week or so. I also want you to follow-up with your primary care in the next couple of days, if you do not have 1 you can go to [facility]. Flonase will also help with your congestion, did prescribe this to you. I would avoid smoking as this is most likely exacerbating your cough.  As was recommended a year ago, please get a repeat CT in regards to the atypical nodule that was found on your CT.  Your PCP will advise you on how to get this.  Get help right away if: You cough up blood. You have chest pain. You have very bad shortness of breath. You become dehydrated. You faint or keep feeling like you are going to faint. You keep vomiting. You have a very bad headache. Your fever or chills get worse.

## 2020-10-15 NOTE — ED Provider Notes (Signed)
[facility] EMERGENCY DEPARTMENT Provider Note   CSN: [id number] Arrival date & time: 10/15/20  1343     History Chief Complaint  Patient presents with  . Shortness of Breath    Jeffrey Wyatt is a 40 y.o. male with pertinent past medical history of allergies, anxiety, tobacco abuse that presents the emergency department today for shortness of breath.  Patient states that he is not currently having shortness of breath, shortness of breath is intermittent, has been having it since Sunday, for the past 4 days. No orthopnea. Patient states that he also has a cough, is nonproductive.  Has been having the cough for the past 2 weeks or so.  Denies any chest pain.  Patient states that he is generally healthy, does not take any medications besides allergy medications and omeprazole.  Denies any hazardous material exposures at work.  Patient states that he does work in a [employer].  States that he has not been around anyone sick that he is aware of, denies getting checked for COVID in the past couple of weeks.  Has not been vaccinated against COVID.  States that he tried Mucinex without relief.  States that shortness of breath is worse at night, when I asked him more about the shortness of breath he states that it is more congestion than shortness of breath.  Denies any wheezing.  Denies any abdominal pain, nausea, vomiting, diarrhea, anosmia.  Denies any myalgias or headache.  Denies any facial pain.  No other complaints.Marland Kitchen  HPI     Past Medical History:  Diagnosis Date  . Allergy   . Anxiety   . Tobacco use     Patient Active Problem List   Diagnosis Date Noted  . Other chest pain 05/09/2019  . Abnormal chest x-ray 05/09/2019  . Mucosal abnormality of stomach   . Reflux esophagitis   . Dysphagia, pharyngoesophageal phase   . Esophageal dysphagia 04/09/2015    Past Surgical History:  Procedure Laterality Date  . ESOPHAGEAL DILATION N/A 04/10/2015   Procedure:  ESOPHAGEAL DILATION;  Surgeon: Corbin Ade, MD;  Location: [facility];  Service: Endoscopy;  Laterality: N/A;  . ESOPHAGOGASTRODUODENOSCOPY N/A 04/10/2015   [id number] reflux esophagitis/HH  . HAND SURGERY     left       Family History  Problem Relation Age of Onset  . Colon cancer Neg Hx     Social History   Tobacco Use  . Smoking status: Current Every Day Smoker    Packs/day: 1.50    Years: 28.00    Pack years: 42.00    Types: Cigarettes  . Smokeless tobacco: Never Used    Home Medications Prior to Admission medications   Medication Sig Start Date End Date Taking? Authorizing Provider  fluticasone (FLONASE) 50 MCG/ACT nasal spray Place 2 sprays into both nostrils daily for 5 days. 10/15/20 10/20/20 Yes Grafton Warzecha, PA-C  albuterol (VENTOLIN HFA) 108 (90 Base) MCG/ACT inhaler Inhale 2 puffs into the lungs every 6 (six) hours as needed for wheezing or shortness of breath. 05/09/19   Corum, Minerva Fester, MD  benzonatate (TESSALON) 200 MG capsule Take 1 capsule (200 mg total) by mouth 2 (two) times daily as needed for cough. 05/24/19   Wandra Feinstein, MD  cetirizine (ZYRTEC) 10 MG chewable tablet Chew 1 tablet (10 mg total) by mouth daily. 04/24/19   Wurst, [place], PA-C  diphenhydramine-acetaminophen (TYLENOL PM) 25-500 MG TABS tablet Take 8 tablets by mouth daily as needed (for  sleep/pain).    [provider]  omeprazole (PRILOSEC) 40 MG capsule Take 1 capsule (40 mg total) by mouth daily. 05/09/19   Corum, Minerva FesterLisa L, MD    Allergies    Patient has no known allergies.  Review of Systems   Review of Systems  Constitutional: Negative for chills, diaphoresis, fatigue and fever.  HENT: Negative for congestion, sore throat and trouble swallowing.   Eyes: Negative for pain and visual disturbance.  Respiratory: Positive for cough and shortness of breath. Negative for wheezing.   Cardiovascular: Negative for chest pain, palpitations and leg swelling.  Gastrointestinal:  Negative for abdominal distention, abdominal pain, diarrhea, nausea and vomiting.  Genitourinary: Negative for difficulty urinating.  Musculoskeletal: Negative for back pain, neck pain and neck stiffness.  Skin: Negative for pallor.  Neurological: Negative for dizziness, speech difficulty, weakness and headaches.  Psychiatric/Behavioral: Negative for confusion.    Physical Exam Updated Vital Signs BP (!) 130/96   Pulse 67   Temp 98.8 F (37.1 C) (Oral)   Resp 18   Ht 6\' 1"  (1.854 m)   Wt 77.1 kg   SpO2 100%   BMI 22.43 kg/m   Physical Exam Constitutional:      General: He is not in acute distress.    Appearance: Normal appearance. He is not ill-appearing, toxic-appearing or diaphoretic.     Comments: Patient without acute respiratory stress.  Patient is sitting comfortably in bed, no tripoding, use of accessory muscles.  Patient is speaking to me in full sentences.  Handling secretions well.  HENT:     Head: Normocephalic and atraumatic.     Jaw: There is normal jaw occlusion. No trismus, swelling or malocclusion.     Nose: Congestion present. No rhinorrhea.     Right Sinus: No maxillary sinus tenderness or frontal sinus tenderness.     Left Sinus: No maxillary sinus tenderness or frontal sinus tenderness.     Mouth/Throat:     Mouth: Mucous membranes are moist. No oral lesions.     Dentition: Normal dentition.     Tongue: No lesions.     Palate: No mass and lesions.     Pharynx: Oropharynx is clear. Uvula midline. No pharyngeal swelling, oropharyngeal exudate, posterior oropharyngeal erythema or uvula swelling.     Tonsils: No tonsillar exudate or tonsillar abscesses. 1+ on the right. 1+ on the left.     Comments: Patient without tonsillar enlargement or exudate.  No signs of peritonsillar abscess, palate without any tenderness or masses palpated.  No swelling under the tongue, uvula is midline without any inflammation. Eyes:     General: No visual field deficit or scleral  icterus.       Right eye: No discharge.        Left eye: No discharge.     Extraocular Movements: Extraocular movements intact.     Conjunctiva/sclera: Conjunctivae normal.     Pupils: Pupils are equal, round, and reactive to light.  Cardiovascular:     Rate and Rhythm: Normal rate and regular rhythm.     Pulses: Normal pulses.     Heart sounds: Normal heart sounds. No murmur heard. No friction rub. No gallop.   Pulmonary:     Effort: Pulmonary effort is normal. No respiratory distress.     Breath sounds: Normal breath sounds. No stridor. No wheezing, rhonchi or rales.  Chest:     Chest wall: No tenderness.  Abdominal:     General: Abdomen is flat. Bowel sounds are normal.  There is no distension.     Palpations: Abdomen is soft.     Tenderness: There is no abdominal tenderness. There is no right CVA tenderness, left CVA tenderness, guarding or rebound.  Musculoskeletal:        General: No swelling or tenderness. Normal range of motion.     Cervical back: Normal range of motion and neck supple. No rigidity or tenderness.     Right lower leg: No edema.     Left lower leg: No edema.  Lymphadenopathy:     Cervical: No cervical adenopathy.  Skin:    General: Skin is warm and dry.     Capillary Refill: Capillary refill takes less than 2 seconds.     Coloration: Skin is not pale.     Findings: No erythema or rash.  Neurological:     General: No focal deficit present.     Mental Status: He is alert and oriented to person, place, and time.     Cranial Nerves: Cranial nerves are intact. No cranial nerve deficit or facial asymmetry.     Motor: Motor function is intact. No weakness.     Coordination: Coordination is intact.     Gait: Gait is intact. Gait normal.  Psychiatric:        Mood and Affect: Mood normal.        Behavior: Behavior normal.     ED Results / Procedures / Treatments   Labs (all labs ordered are listed, but only abnormal results are displayed) Labs Reviewed   SARS CORONAVIRUS 2 (TAT 6-24 HRS)  BASIC METABOLIC PANEL  CBC    EKG EKG Interpretation  Date/Time:  Wednesday October 15 2020 15:09:22 EST Ventricular Rate:  88 PR Interval:  138 QRS Duration: 100 QT Interval:  368 QTC Calculation: 445 R Axis:   91 Text Interpretation: Normal sinus rhythm Rightward axis Borderline ECG No significant change since prior 1/20 Confirmed by Meridee Score [phone]) on 10/15/2020 6:18:18 PM   Radiology DG Chest 2 View  Result Date: 10/15/2020 CLINICAL DATA:  Shortness of breath EXAM: CHEST - 2 VIEW COMPARISON:  04/24/2019, CT 05/15/2019 FINDINGS: The heart size and mediastinal contours are within normal limits. Both lungs are clear. The visualized skeletal structures are unremarkable. IMPRESSION: No active cardiopulmonary disease. Electronically Signed   By: Jasmine Pang M.D.   On: 10/15/2020 15:25    Procedures Procedures (including critical care time)  Medications Ordered in ED Medications  albuterol (VENTOLIN HFA) 108 (90 Base) MCG/ACT inhaler 4 puff (4 puffs Inhalation Given 10/15/20 2006)  ipratropium (ATROVENT HFA) inhaler 2 puff (2 puffs Inhalation Given 10/15/20 2006)    ED Course  I have reviewed the triage vital signs and the nursing notes.  Pertinent labs & imaging results that were available during my care of the patient were reviewed by me and considered in my medical decision making (see chart for details).    MDM Rules/Calculators/A&P                          Jeffrey Wyatt is a 40 y.o. male with pertinent past medical history of allergies, anxiety, tobacco abuse that presents the emergency department today for shortness of breath.  Patient states he is not currently short of breath, satting at 100% on room air, nontachypneic, patient appears very well.  Patient has not been vaccinated against COVID, under the current circumstances I think patient most likely has COVID at this time or did  have COVID and now has residual cough.   Will obtain 6 to 24-hour COVID swab since patient appears well and will most likely be discharged.  We will also trial some albuterol here to see if patient feels better with this since patient does have extensive allergy history. PERC negative.  Work-up today with CBC and BMP unremarkable.  Chest x-ray interpreted me without any acute cardiopulmonary disease.  EKG interpreted me with normal sinus rhythm.  Per chart review patient did have CT scan 1 year ago that did show 7 mm right apical nodule which could reflect nodular scarring, they did recommend repeat CT in 6 months.  Patient states that he has not been able to do this yet.  Do not think that we need CT imaging here in the emergency department today because patient is not short of breath, no type B symptoms.  Did express to patient that he needs to follow-up with PCP in regards to outpatient imaging for this, patient expressed understanding.  Upon evaluation patient does state that he feels much better with albuterol and Atrovent on board.  Patient appears very well.  Upon ambulation patient remained above 95% on room air, patient to be discharged at this time.  Symptomatic treatment discussed in regards to bronchitis, patient could also have had COVID and now he is left with a cough with some mild shortness of breath that is intermittent.  No red flag symptoms, patient to follow-up with PCP, symptomatic treatment discussed.  Strict return precautions given.  Doubt need for further emergent work up at this time. I explained the diagnosis and have given explicit precautions to return to the ER including for any other new or worsening symptoms. The patient understands and accepts the medical plan as it's been dictated and I have answered their questions. Discharge instructions concerning home care and prescriptions have been given. The patient is STABLE and is discharged to home in good condition.  Jeffrey Wyatt was evaluated in Emergency  Department on 10/15/2020 for the symptoms described in the history of present illness. He was evaluated in the context of the global COVID-19 pandemic, which necessitated consideration that the patient might be at risk for infection with the SARS-CoV-2 virus that causes COVID-19. Institutional protocols and algorithms that pertain to the evaluation of patients at risk for COVID-19 are in a state of rapid change based on information released by regulatory bodies including the CDC and federal and state organizations. These policies and algorithms were followed during the patient's care in the ED.  The patient was counseled on the dangers of tobacco use, and was advised to quit.  Reviewed strategies to maximize success, including removing cigarettes and smoking materials from environment, stress management, substitution of other forms of reinforcement, support of family/friends and written materials. Total time was 5 min CPT code 97989.   Final Clinical Impression(s) / ED Diagnoses Final diagnoses:  Bronchitis    Rx / DC Orders ED Discharge Orders         Ordered    fluticasone (FLONASE) 50 MCG/ACT nasal spray  Daily        10/15/20 2015           Farrel Gordon, PA-C 10/15/20 2031    Terrilee Files, MD 10/16/20 [phone]

## 2020-10-15 NOTE — ED Notes (Signed)
Pt's O2 stayed between 95 and 100% while ambulating

## 2020-10-15 NOTE — ED Triage Notes (Signed)
Pt c.o sob since Sunday, denies any other symptoms. Resp e.u

## 2020-10-16 LAB — SARS CORONAVIRUS 2 (TAT 6-24 HRS): SARS Coronavirus 2: NEGATIVE

## 2021-05-19 ENCOUNTER — Emergency Department (HOSPITAL_COMMUNITY): Payer: 59

## 2021-05-19 ENCOUNTER — Other Ambulatory Visit: Payer: Self-pay

## 2021-05-19 ENCOUNTER — Encounter (HOSPITAL_COMMUNITY): Payer: Self-pay | Admitting: Emergency Medicine

## 2021-05-19 ENCOUNTER — Emergency Department (HOSPITAL_COMMUNITY)
Admission: EM | Admit: 2021-05-19 | Discharge: 2021-05-20 | Disposition: A | Discharge status: Home / Self Care | Payer: 59 | Attending: Emergency Medicine | Admitting: Emergency Medicine

## 2021-05-19 DIAGNOSIS — Z7951 Long term (current) use of inhaled steroids: Secondary | ICD-10-CM | POA: Insufficient documentation

## 2021-05-19 DIAGNOSIS — F17210 Nicotine dependence, cigarettes, uncomplicated: Secondary | ICD-10-CM | POA: Diagnosis not present

## 2021-05-19 DIAGNOSIS — Z20822 Contact with and (suspected) exposure to covid-19: Secondary | ICD-10-CM | POA: Diagnosis not present

## 2021-05-19 DIAGNOSIS — J449 Chronic obstructive pulmonary disease, unspecified: Secondary | ICD-10-CM | POA: Diagnosis not present

## 2021-05-19 DIAGNOSIS — R0602 Shortness of breath: Secondary | ICD-10-CM | POA: Diagnosis present

## 2021-05-19 LAB — TROPONIN I (HIGH SENSITIVITY): Troponin I (High Sensitivity): 3 ng/L (ref <18)

## 2021-05-19 LAB — CBC WITH DIFFERENTIAL/PLATELET
Abs Immature Granulocytes: 0.04 10*3/uL (ref 0.00–0.07)
Basophils Absolute: 0.1 10*3/uL (ref 0.0–0.1)
Basophils Relative: 1 %
Eosinophils Absolute: 0.5 10*3/uL (ref 0.0–0.5)
Eosinophils Relative: 4 %
HCT: 44.8 % (ref 39.0–52.0)
Hemoglobin: 14.7 g/dL (ref 13.0–17.0)
Immature Granulocytes: 0 %
Lymphocytes Relative: 34 %
Lymphs Abs: 3.5 10*3/uL (ref 0.7–4.0)
MCH: 29.6 pg (ref 26.0–34.0)
MCHC: 32.8 g/dL (ref 30.0–36.0)
MCV: 90.1 fL (ref 80.0–100.0)
Monocytes Absolute: 0.5 10*3/uL (ref 0.1–1.0)
Monocytes Relative: 4 %
Neutro Abs: 5.8 10*3/uL (ref 1.7–7.7)
Neutrophils Relative %: 57 %
Platelets: 259 10*3/uL (ref 150–400)
RBC: 4.97 MIL/uL (ref 4.22–5.81)
RDW: 12.1 % (ref 11.5–15.5)
WBC: 10.4 10*3/uL (ref 4.0–10.5)
nRBC: 0 % (ref 0.0–0.2)

## 2021-05-19 LAB — BASIC METABOLIC PANEL
Anion gap: 6 (ref 5–15)
BUN: 8 mg/dL (ref 6–20)
CO2: 28 mmol/L (ref 22–32)
Calcium: 9.2 mg/dL (ref 8.9–10.3)
Chloride: 105 mmol/L (ref 98–111)
Creatinine, Ser: 0.72 mg/dL (ref 0.61–1.24)
GFR, Estimated: >60 mL/min (ref >60)
Glucose, Bld: 91 mg/dL (ref 70–99)
Potassium: 3.5 mmol/L (ref 3.5–5.1)
Sodium: 139 mmol/L (ref 135–145)

## 2021-05-19 LAB — BRAIN NATRIURETIC PEPTIDE: B Natriuretic Peptide: 95 pg/mL (ref 0.0–100.0)

## 2021-05-19 MED ORDER — PREDNISONE 50 MG PO TABS
60.0000 mg | ORAL_TABLET | Freq: Once | ORAL | Status: AC
  Administered 2021-05-19: 60 mg via ORAL
  Filled 2021-05-19: qty 1

## 2021-05-19 MED ORDER — ALBUTEROL SULFATE HFA 108 (90 BASE) MCG/ACT IN AERS
2.0000 | INHALATION_SPRAY | Freq: Once | RESPIRATORY_TRACT | Status: AC
  Administered 2021-05-19: 2 via RESPIRATORY_TRACT
  Filled 2021-05-19: qty 6.7

## 2021-05-19 NOTE — ED Triage Notes (Signed)
Pt c/o dizziness intermittently x one month and sob constantly x one month. Pt talking in full sentences and no distress at this time.

## 2021-05-19 NOTE — ED Provider Notes (Signed)
[facility] EMERGENCY DEPARTMENT Provider Note   CSN: [id number] Arrival date & time: 05/19/21  1858     History Chief Complaint  Patient presents with   Dizziness    Jeffrey Wyatt is a 40 y.o. male.  Pt is a 40 yo male presenting for sob. Patient admits to shortness of breath and wheezing intermittently for one month. Admits to chest tightness during episodes but denies chest pain. Denies fevers, chills, or new cough. Admits to chronic morning cough. Pt active smoker for several  years.   The history is provided by the patient. No language interpreter was used.  Wheezing Severity:  Moderate Onset quality:  Gradual Timing:  Constant Progression:  Worsening Chronicity:  New Associated symptoms: chest tightness   Associated symptoms: no chest pain, no cough, no ear pain, no fever, no rash, no shortness of breath and no sore throat       Past Medical History:  Diagnosis Date   Allergy    Anxiety    Tobacco use     Patient Active Problem List   Diagnosis Date Noted   Other chest pain 05/09/2019   Abnormal chest x-ray 05/09/2019   Mucosal abnormality of stomach    Reflux esophagitis    Dysphagia, pharyngoesophageal phase    Esophageal dysphagia 04/09/2015    Past Surgical History:  Procedure Laterality Date   ESOPHAGEAL DILATION N/A 04/10/2015   Procedure: ESOPHAGEAL DILATION;  Surgeon: Corbin Ade, MD;  Location: [facility];  Service: Endoscopy;  Laterality: N/A;   ESOPHAGOGASTRODUODENOSCOPY N/A 04/10/2015   [id number] reflux esophagitis/HH   HAND SURGERY     left       Family History  Problem Relation Age of Onset   Colon cancer Neg Hx     Social History   Tobacco Use   Smoking status: Every Day    Packs/day: 1.50    Years: 28.00    Pack years: 42.00    Types: Cigarettes   Smokeless tobacco: Never    Home Medications Prior to Admission medications   Medication Sig Start Date End Date Taking? Authorizing Provider  albuterol  (VENTOLIN HFA) 108 (90 Base) MCG/ACT inhaler Inhale 2 puffs into the lungs every 6 (six) hours as needed for wheezing or shortness of breath. 05/09/19   Corum, Minerva Fester, MD  benzonatate (TESSALON) 200 MG capsule Take 1 capsule (200 mg total) by mouth 2 (two) times daily as needed for cough. 05/24/19   Wandra Feinstein, MD  cetirizine (ZYRTEC) 10 MG chewable tablet Chew 1 tablet (10 mg total) by mouth daily. 04/24/19   Wurst, [place], PA-C  diphenhydramine-acetaminophen (TYLENOL PM) 25-500 MG TABS tablet Take 8 tablets by mouth daily as needed (for sleep/pain).    [provider]  fluticasone (FLONASE) 50 MCG/ACT nasal spray Place 2 sprays into both nostrils daily for 5 days. 10/15/20 10/20/20  Farrel Gordon, PA-C  omeprazole (PRILOSEC) 40 MG capsule Take 1 capsule (40 mg total) by mouth daily. 05/09/19   Corum, Minerva Fester, MD    Allergies    Patient has no known allergies.  Review of Systems   Review of Systems  Constitutional:  Negative for chills and fever.  HENT:  Negative for ear pain and sore throat.   Eyes:  Negative for pain and visual disturbance.  Respiratory:  Positive for chest tightness and wheezing. Negative for cough and shortness of breath.   Cardiovascular:  Negative for chest pain and palpitations.  Gastrointestinal:  Negative for abdominal pain  and vomiting.  Genitourinary:  Negative for dysuria and hematuria.  Musculoskeletal:  Negative for arthralgias and back pain.  Skin:  Negative for color change and rash.  Neurological:  Negative for seizures and syncope.  All other systems reviewed and are negative.  Physical Exam Updated Vital Signs BP 123/89   Pulse 84   Temp 97.8 F (36.6 C) (Oral)   Resp 15   Ht 6' (1.829 m)   Wt 75.9 kg   SpO2 99%   BMI 22.69 kg/m   Physical Exam Vitals and nursing note reviewed.  Constitutional:      Appearance: He is well-developed.  HENT:     Head: Normocephalic and atraumatic.  Eyes:     Conjunctiva/sclera: Conjunctivae  normal.  Cardiovascular:     Rate and Rhythm: Normal rate and regular rhythm.     Heart sounds: No murmur heard. Pulmonary:     Effort: Pulmonary effort is normal. No respiratory distress.     Breath sounds: Examination of the right-upper field reveals wheezing. Examination of the left-upper field reveals wheezing. Examination of the right-middle field reveals wheezing. Examination of the left-middle field reveals wheezing. Examination of the right-lower field reveals wheezing. Examination of the left-lower field reveals wheezing. Wheezing present.  Abdominal:     Palpations: Abdomen is soft.     Tenderness: There is no abdominal tenderness.  Musculoskeletal:     Cervical back: Neck supple.  Skin:    General: Skin is warm and dry.  Neurological:     Mental Status: He is alert.    ED Results / Procedures / Treatments   Labs (all labs ordered are listed, but only abnormal results are displayed) Labs Reviewed  RESP PANEL BY RT-PCR (FLU A&B, COVID) [id number]  CBC WITH DIFFERENTIAL/PLATELET  BASIC METABOLIC PANEL  BRAIN NATRIURETIC PEPTIDE  TROPONIN I (HIGH SENSITIVITY)    EKG None  Radiology DG Chest Portable 1 View  Result Date: 05/19/2021 CLINICAL DATA:  Wheezing EXAM: PORTABLE CHEST 1 VIEW COMPARISON:  10/15/2020 FINDINGS: Heart and mediastinal contours are within normal limits. No focal opacities or effusions. No acute bony abnormality. IMPRESSION: No active cardiopulmonary disease. Electronically Signed   By: Charlett Nose M.D.   On: 05/19/2021 23:04    Procedures Procedures   Medications Ordered in ED Medications  albuterol (VENTOLIN HFA) 108 (90 Base) MCG/ACT inhaler 2 puff (2 puffs Inhalation Given 05/19/21 2255)  predniSONE (DELTASONE) tablet 60 mg (60 mg Oral Given 05/19/21 2322)    ED Course  I have reviewed the triage vital signs and the nursing notes.  Pertinent labs & imaging results that were available during my care of the patient were reviewed by me and  considered in my medical decision making (see chart for details).    MDM Rules/Calculators/A&P 12:08 AM 40 yo male presenting for sob, wheezing, and chest tightness x 1 month. Pt is Aox3, no acute distress, afebrile, with stable vitals. Physical exam demonstrates wheezing in all lung fields.   Albuterol and solumedrol given in ED for likely COPD exacerbation.   Ekg stable with no ST segment elevation or depression. Stable troponin, bnp, cxr, and electrolytes.  I spent at least 3 minutes discussing the dangers of smoking and encouraged them to quit.  Patient in no distress and overall condition improved here in the ED. Prednisone and doxycyline sent to pharmacy for COPD exacerbation. Detailed discussions were had with the patient regarding current findings, and need for close f/u with PCP or on call doctor. The patient  has been instructed to return immediately if the symptoms worsen in any way for re-evaluation. Patient verbalized understanding and is in agreement with current care plan. All questions answered prior to discharge.  Final Clinical Impression(s) / ED Diagnoses Final diagnoses:  Chronic obstructive pulmonary disease, unspecified COPD type (HCC)  Cigarette smoker    Rx / DC Orders ED Discharge Orders     None        Franne Forts, DO 05/20/21 0104

## 2021-05-20 LAB — RESP PANEL BY RT-PCR (FLU A&B, COVID) ARPGX2
Influenza A by PCR: NEGATIVE
Influenza B by PCR: NEGATIVE
SARS Coronavirus 2 by RT PCR: NEGATIVE

## 2021-05-20 MED ORDER — DOXYCYCLINE HYCLATE 100 MG PO CAPS: 100.0000 mg | ORAL_CAPSULE | Freq: Two times a day (BID) | ORAL | 0 refills | Status: DC

## 2021-05-20 MED ORDER — PREDNISONE 20 MG PO TABS: 20.0000 mg | ORAL_TABLET | Freq: Every day | ORAL | 0 refills | Status: AC

## 2021-05-20 MED ORDER — PREDNISONE 20 MG PO TABS: 20.0000 mg | ORAL_TABLET | Freq: Every day | ORAL | Status: DC

## 2021-07-14 ENCOUNTER — Other Ambulatory Visit: Payer: Self-pay

## 2021-07-14 ENCOUNTER — Encounter (HOSPITAL_COMMUNITY): Payer: Self-pay

## 2021-07-14 DIAGNOSIS — R0602 Shortness of breath: Secondary | ICD-10-CM | POA: Diagnosis present

## 2021-07-14 DIAGNOSIS — E876 Hypokalemia: Secondary | ICD-10-CM | POA: Diagnosis not present

## 2021-07-14 DIAGNOSIS — F17210 Nicotine dependence, cigarettes, uncomplicated: Secondary | ICD-10-CM | POA: Insufficient documentation

## 2021-07-14 DIAGNOSIS — Z20822 Contact with and (suspected) exposure to covid-19: Secondary | ICD-10-CM | POA: Diagnosis not present

## 2021-07-14 NOTE — ED Triage Notes (Signed)
Intermittent dizziness and SOB for 2 weeks.   100% on room air.

## 2021-07-15 ENCOUNTER — Emergency Department (HOSPITAL_COMMUNITY)
Admission: EM | Admit: 2021-07-15 | Discharge: 2021-07-15 | Disposition: A | Discharge status: Home / Self Care | Payer: 59 | Attending: Emergency Medicine | Admitting: Emergency Medicine

## 2021-07-15 ENCOUNTER — Emergency Department (HOSPITAL_COMMUNITY): Payer: 59

## 2021-07-15 DIAGNOSIS — E876 Hypokalemia: Secondary | ICD-10-CM

## 2021-07-15 DIAGNOSIS — R0602 Shortness of breath: Secondary | ICD-10-CM

## 2021-07-15 LAB — COMPREHENSIVE METABOLIC PANEL WITH GFR
ALT: 8 U/L (ref 0–44)
AST: 13 U/L — ABNORMAL LOW (ref 15–41)
Albumin: 4.1 g/dL (ref 3.5–5.0)
Alkaline Phosphatase: 69 U/L (ref 38–126)
Anion gap: 7 (ref 5–15)
BUN: 8 mg/dL (ref 6–20)
CO2: 27 mmol/L (ref 22–32)
Calcium: 9.2 mg/dL (ref 8.9–10.3)
Chloride: 104 mmol/L (ref 98–111)
Creatinine, Ser: 0.84 mg/dL (ref 0.61–1.24)
GFR, Estimated: >60 mL/min
Glucose, Bld: 91 mg/dL (ref 70–99)
Potassium: 3.3 mmol/L — ABNORMAL LOW (ref 3.5–5.1)
Sodium: 138 mmol/L (ref 135–145)
Total Bilirubin: 0.1 mg/dL — ABNORMAL LOW (ref 0.3–1.2)
Total Protein: 7.2 g/dL (ref 6.5–8.1)

## 2021-07-15 LAB — CBC WITH DIFFERENTIAL/PLATELET
Abs Immature Granulocytes: 0.04 K/uL (ref 0.00–0.07)
Basophils Absolute: 0.1 K/uL (ref 0.0–0.1)
Basophils Relative: 1 %
Eosinophils Absolute: 0.4 K/uL (ref 0.0–0.5)
Eosinophils Relative: 4 %
HCT: 43 % (ref 39.0–52.0)
Hemoglobin: 14.2 g/dL (ref 13.0–17.0)
Immature Granulocytes: 0 %
Lymphocytes Relative: 35 %
Lymphs Abs: 3.7 K/uL (ref 0.7–4.0)
MCH: 29.7 pg (ref 26.0–34.0)
MCHC: 33 g/dL (ref 30.0–36.0)
MCV: 90 fL (ref 80.0–100.0)
Monocytes Absolute: 0.5 K/uL (ref 0.1–1.0)
Monocytes Relative: 5 %
Neutro Abs: 5.7 K/uL (ref 1.7–7.7)
Neutrophils Relative %: 55 %
Platelets: 273 K/uL (ref 150–400)
RBC: 4.78 MIL/uL (ref 4.22–5.81)
RDW: 12.8 % (ref 11.5–15.5)
WBC: 10.4 K/uL (ref 4.0–10.5)
nRBC: 0 % (ref 0.0–0.2)

## 2021-07-15 LAB — RESP PANEL BY RT-PCR (FLU A&B, COVID) ARPGX2
Influenza A by PCR: NEGATIVE
Influenza B by PCR: NEGATIVE
SARS Coronavirus 2 by RT PCR: NEGATIVE

## 2021-07-15 LAB — TROPONIN I (HIGH SENSITIVITY): Troponin I (High Sensitivity): 2 ng/L (ref <18)

## 2021-07-15 LAB — BRAIN NATRIURETIC PEPTIDE: B Natriuretic Peptide: 41 pg/mL (ref 0.0–100.0)

## 2021-07-15 MED ORDER — ALBUTEROL SULFATE HFA 108 (90 BASE) MCG/ACT IN AERS
4.0000 | INHALATION_SPRAY | Freq: Once | RESPIRATORY_TRACT | Status: AC
  Administered 2021-07-15: 4 via RESPIRATORY_TRACT
  Filled 2021-07-15: qty 6.7

## 2021-07-15 MED ORDER — PREDNISONE 50 MG PO TABS
60.0000 mg | ORAL_TABLET | Freq: Once | ORAL | Status: AC
  Administered 2021-07-15: 60 mg via ORAL
  Filled 2021-07-15: qty 1

## 2021-07-15 MED ORDER — POTASSIUM CHLORIDE CRYS ER 20 MEQ PO TBCR
40.0000 meq | EXTENDED_RELEASE_TABLET | Freq: Once | ORAL | Status: AC
  Administered 2021-07-15: 40 meq via ORAL
  Filled 2021-07-15: qty 2

## 2021-07-15 MED ORDER — PREDNISONE 20 MG PO TABS: ORAL_TABLET | ORAL | 0 refills | Status: DC

## 2021-07-15 MED ORDER — ALBUTEROL SULFATE HFA 108 (90 BASE) MCG/ACT IN AERS: 2.0000 | INHALATION_SPRAY | Freq: Once | RESPIRATORY_TRACT | Status: DC

## 2021-07-15 MED ORDER — IPRATROPIUM-ALBUTEROL 0.5-2.5 (3) MG/3ML IN SOLN
3.0000 mL | RESPIRATORY_TRACT | Status: AC
Start: 2021-07-15 — End: 2021-07-15
  Administered 2021-07-15 (×3): 3 mL via RESPIRATORY_TRACT
  Filled 2021-07-15: qty 3
  Filled 2021-07-15: qty 9

## 2021-07-15 NOTE — ED Provider Notes (Signed)
[facility] EMERGENCY DEPARTMENT Provider Note   CSN: [id number] Arrival date & time: 07/14/21  2340     History Chief Complaint  Patient presents with   Shortness of Breath    2 wks   Dizziness    2wks    Jeffrey Wyatt is a 40 y.o. male.  40 year old male with approximately 30-pack-year smoking history who presents emerged from today with shortness of breath.  Patient states he has been short of breath off and on for the last couple months but has been progressively worsened.  He also has now cough is productive of clear sputum.  He also has been having some chest pressure.  None the stuff seems to be worse with movement but can come and go at any time.  Patient states he has not had this constellation of symptoms before but has had bronchitis causing similar breathing symptoms that is what he is experiencing now.  No fevers.  No sick contacts.  Never had any cardiac issues that he knows of   Shortness of Breath Dizziness Associated symptoms: shortness of breath       Past Medical History:  Diagnosis Date   Allergy    Anxiety    Tobacco use     Patient Active Problem List   Diagnosis Date Noted   Other chest pain 05/09/2019   Abnormal chest x-ray 05/09/2019   Mucosal abnormality of stomach    Reflux esophagitis    Dysphagia, pharyngoesophageal phase    Esophageal dysphagia 04/09/2015    Past Surgical History:  Procedure Laterality Date   ESOPHAGEAL DILATION N/A 04/10/2015   Procedure: ESOPHAGEAL DILATION;  Surgeon: Corbin Ade, MD;  Location: [facility];  Service: Endoscopy;  Laterality: N/A;   ESOPHAGOGASTRODUODENOSCOPY N/A 04/10/2015   YQM:VHQIONG reflux esophagitis/HH   HAND SURGERY     left       Family History  Problem Relation Age of Onset   Colon cancer Neg Hx     Social History   Tobacco Use   Smoking status: Every Day    Packs/day: 1.50    Years: 28.00    Pack years: 42.00    Types: Cigarettes   Smokeless tobacco: Never     Home Medications Prior to Admission medications   Medication Sig Start Date End Date Taking? Authorizing Provider  predniSONE (DELTASONE) 20 MG tablet 3 tabs po daily x 3 days, then 2 tabs x 3 days, then 1.5 tabs x 3 days, then 1 tab x 3 days, then 0.5 tabs x 3 days 07/15/21  Yes Augustina Braddock, Barbara Cower, MD  albuterol (VENTOLIN HFA) 108 (90 Base) MCG/ACT inhaler Inhale 2 puffs into the lungs every 6 (six) hours as needed for wheezing or shortness of breath. 05/09/19   Corum, Minerva Fester, MD  benzonatate (TESSALON) 200 MG capsule Take 1 capsule (200 mg total) by mouth 2 (two) times daily as needed for cough. 05/24/19   Wandra Feinstein, MD  cetirizine (ZYRTEC) 10 MG chewable tablet Chew 1 tablet (10 mg total) by mouth daily. 04/24/19   Wurst, [place], PA-C  diphenhydramine-acetaminophen (TYLENOL PM) 25-500 MG TABS tablet Take 8 tablets by mouth daily as needed (for sleep/pain).    [provider]  doxycycline (VIBRAMYCIN) 100 MG capsule Take 1 capsule (100 mg total) by mouth 2 (two) times daily. 05/20/21   Edwin Dada P, DO  fluticasone (FLONASE) 50 MCG/ACT nasal spray Place 2 sprays into both nostrils daily for 5 days. 10/15/20 10/20/20  Farrel Gordon, PA-C  omeprazole (PRILOSEC) 40 MG capsule Take 1 capsule (40 mg total) by mouth daily. 05/09/19   Corum, Minerva Fester, MD    Allergies    Patient has no known allergies.  Review of Systems   Review of Systems  Respiratory:  Positive for shortness of breath.   Neurological:  Positive for dizziness.  All other systems reviewed and are negative.  Physical Exam Updated Vital Signs BP 116/83   Pulse 75   Temp 98.5 F (36.9 C) (Oral)   Resp 16   Ht 6\' 1"  (1.854 m)   Wt 74.8 kg   SpO2 100%   BMI 21.77 kg/m   Physical Exam Vitals and nursing note reviewed.  Constitutional:      Appearance: He is well-developed.  HENT:     Head: Normocephalic and atraumatic.  Cardiovascular:     Rate and Rhythm: Normal rate.  Pulmonary:     Effort: Pulmonary  effort is normal. No respiratory distress.     Breath sounds: Examination of the right-upper field reveals decreased breath sounds and rhonchi. Examination of the right-middle field reveals decreased breath sounds and rhonchi. Examination of the right-lower field reveals decreased breath sounds and rhonchi. Decreased breath sounds and rhonchi present.  Abdominal:     General: There is no distension.  Musculoskeletal:        General: Normal range of motion.     Cervical back: Normal range of motion.  Skin:    General: Skin is warm and dry.  Neurological:     General: No focal deficit present.     Mental Status: He is alert.    ED Results / Procedures / Treatments   Labs (all labs ordered are listed, but only abnormal results are displayed) Labs Reviewed  COMPREHENSIVE METABOLIC PANEL - Abnormal; Notable for the following components:      Result Value   Potassium 3.3 (*)    AST 13 (*)    Total Bilirubin 0.1 (*)    All other components within normal limits  RESP PANEL BY RT-PCR (FLU A&B, COVID) ARPGX2  CBC WITH DIFFERENTIAL/PLATELET  BRAIN NATRIURETIC PEPTIDE  TROPONIN I (HIGH SENSITIVITY)    EKG None  Radiology DG Chest 2 View  Result Date: 07/15/2021 CLINICAL DATA:  Shortness of breath and dizziness EXAM: CHEST - 2 VIEW COMPARISON:  05/19/2021 FINDINGS: Cardiac shadow is within normal limits. Lungs are well aerated bilaterally. No focal infiltrate or sizable effusion is seen. No bony abnormality is noted. IMPRESSION: No acute abnormality noted. Electronically Signed   By: 05/21/2021 M.D.   On: 07/15/2021 00:38    Procedures Procedures   Medications Ordered in ED Medications  albuterol (VENTOLIN HFA) 108 (90 Base) MCG/ACT inhaler 4 puff (4 puffs Inhalation Given 07/15/21 0041)  ipratropium-albuterol (DUONEB) 0.5-2.5 (3) MG/3ML nebulizer solution 3 mL (3 mLs Nebulization Given 07/15/21 0314)  predniSONE (DELTASONE) tablet 60 mg (60 mg Oral Given 07/15/21 0253)  potassium  chloride SA (KLOR-CON) CR tablet 40 mEq (40 mEq Oral Given 07/15/21 0252)    ED Course  I have reviewed the triage vital signs and the nursing notes.  Pertinent labs & imaging results that were available during my care of the patient were reviewed by me and considered in my medical decision making (see chart for details).    MDM Rules/Calculators/A&P                         Possibly viral bronchitis. Also low K. No  other obvious causes for dizziness. Will dc to fu w/ pcp.  Final Clinical Impression(s) / ED Diagnoses Final diagnoses:  Hypokalemia  SOB (shortness of breath)    Rx / DC Orders ED Discharge Orders          Ordered    predniSONE (DELTASONE) 20 MG tablet        07/15/21 0359             Betrice Wanat, Barbara Cower, MD 07/15/21 [phone]

## 2021-11-21 ENCOUNTER — Other Ambulatory Visit: Payer: Self-pay

## 2021-11-21 ENCOUNTER — Emergency Department (HOSPITAL_COMMUNITY)
Admission: EM | Admit: 2021-11-21 | Discharge: 2021-11-21 | Disposition: A | Discharge status: Home / Self Care | Payer: Self-pay | Attending: Emergency Medicine | Admitting: Emergency Medicine

## 2021-11-21 ENCOUNTER — Encounter (HOSPITAL_COMMUNITY): Payer: Self-pay

## 2021-11-21 DIAGNOSIS — L509 Urticaria, unspecified: Secondary | ICD-10-CM | POA: Insufficient documentation

## 2021-11-21 DIAGNOSIS — R0602 Shortness of breath: Secondary | ICD-10-CM | POA: Insufficient documentation

## 2021-11-21 DIAGNOSIS — T7840XA Allergy, unspecified, initial encounter: Secondary | ICD-10-CM | POA: Insufficient documentation

## 2021-11-21 MED ORDER — DIPHENHYDRAMINE HCL 50 MG/ML IJ SOLN
25.0000 mg | Freq: Once | INTRAMUSCULAR | Status: AC
  Administered 2021-11-21: 25 mg via INTRAVENOUS
  Filled 2021-11-21: qty 1

## 2021-11-21 MED ORDER — FAMOTIDINE IN NACL 20-0.9 MG/50ML-% IV SOLN
20.0000 mg | Freq: Once | INTRAVENOUS | Status: AC
  Administered 2021-11-21: 20 mg via INTRAVENOUS
  Filled 2021-11-21: qty 50

## 2021-11-21 MED ORDER — PREDNISONE 20 MG PO TABS: 60.0000 mg | ORAL_TABLET | Freq: Every day | ORAL | 0 refills | Status: DC

## 2021-11-21 MED ORDER — METHYLPREDNISOLONE SODIUM SUCC 125 MG IJ SOLR
125.0000 mg | Freq: Once | INTRAMUSCULAR | Status: AC
  Administered 2021-11-21: 125 mg via INTRAVENOUS
  Filled 2021-11-21: qty 2

## 2021-11-21 NOTE — ED Provider Notes (Signed)
[facility] EMERGENCY DEPARTMENT Provider Note   CSN: [id number] Arrival date & time: 11/21/21  2132     History  Chief Complaint  Patient presents with   Allergic Reaction    Jeffrey Wyatt is a 41 y.o. male.  He is here for evaluation of possible allergic reaction.  He said for the last 3+ days he has had hives all over his body along with intermittent swelling of his tongue and feeling short of breath.  No vomiting or diarrhea.  He does not recall any trigger.  No fevers or chills.  Has tried nothing for it.  No new medications.  The history is provided by the patient.  Allergic Reaction Presenting symptoms: difficulty breathing, rash and swelling (tongue)   Presenting symptoms: no difficulty swallowing   Rash:    Location:  Full body   Quality: itchiness     Onset quality:  Gradual   Duration:  3 days   Timing:  Intermittent   Progression:  Unchanged Prior allergic episodes:  No prior episodes Relieved by:  None tried Worsened by:  Nothing Ineffective treatments:  None tried     Home Medications Prior to Admission medications   Medication Sig Start Date End Date Taking? Authorizing Provider  albuterol (VENTOLIN HFA) 108 (90 Base) MCG/ACT inhaler Inhale 2 puffs into the lungs every 6 (six) hours as needed for wheezing or shortness of breath. 05/09/19   Corum, Minerva FesterLisa L, MD  benzonatate (TESSALON) 200 MG capsule Take 1 capsule (200 mg total) by mouth 2 (two) times daily as needed for cough. 05/24/19   Wandra Feinsteinorum, Lisa L, MD  cetirizine (ZYRTEC) 10 MG chewable tablet Chew 1 tablet (10 mg total) by mouth daily. 04/24/19   Wurst, [place], PA-C  diphenhydramine-acetaminophen (TYLENOL PM) 25-500 MG TABS tablet Take 8 tablets by mouth daily as needed (for sleep/pain).    [provider]  doxycycline (VIBRAMYCIN) 100 MG capsule Take 1 capsule (100 mg total) by mouth 2 (two) times daily. 05/20/21   Edwin DadaGray, Alicia P, DO  fluticasone (FLONASE) 50 MCG/ACT nasal spray Place 2  sprays into both nostrils daily for 5 days. 10/15/20 10/20/20  Farrel GordonPatel, Shalyn, PA-C  omeprazole (PRILOSEC) 40 MG capsule Take 1 capsule (40 mg total) by mouth daily. 05/09/19   Corum, Minerva FesterLisa L, MD  predniSONE (DELTASONE) 20 MG tablet 3 tabs po daily x 3 days, then 2 tabs x 3 days, then 1.5 tabs x 3 days, then 1 tab x 3 days, then 0.5 tabs x 3 days 07/15/21   Mesner, Barbara CowerJason, MD      Allergies    Patient has no known allergies.    Review of Systems   Review of Systems  Constitutional:  Negative for fever.  HENT:  Negative for trouble swallowing.   Eyes:  Negative for visual disturbance.  Respiratory:  Positive for chest tightness and shortness of breath.   Cardiovascular:  Negative for chest pain.  Gastrointestinal:  Negative for diarrhea, nausea and vomiting.  Musculoskeletal:  Negative for gait problem.  Skin:  Positive for rash.   Physical Exam Updated Vital Signs BP 137/85    Pulse 85    Temp 98.8 F (37.1 C) (Oral)    Resp 19    SpO2 100%  Physical Exam Vitals and nursing note reviewed.  Constitutional:      General: He is not in acute distress.    Appearance: Normal appearance. He is well-developed.  HENT:     Head: Normocephalic and atraumatic.  Mouth/Throat:     Mouth: Mucous membranes are moist.     Pharynx: Oropharynx is clear.     Comments: Tongue does not appear swollen, has poor dentition Eyes:     Conjunctiva/sclera: Conjunctivae normal.  Cardiovascular:     Rate and Rhythm: Normal rate and regular rhythm.     Heart sounds: No murmur heard. Pulmonary:     Effort: Pulmonary effort is normal. No respiratory distress.     Breath sounds: Normal breath sounds.  Abdominal:     Palpations: Abdomen is soft.     Tenderness: There is no abdominal tenderness.  Musculoskeletal:        General: No swelling.     Cervical back: Neck supple.  Skin:    General: Skin is warm and dry.     Capillary Refill: Capillary refill takes less than 2 seconds.     Findings: Rash present.      Comments: He has some urticaria over his trunk and upper arms.  He shows me a picture where they were more pronounced earlier.  Neurological:     General: No focal deficit present.     Mental Status: He is alert.     Sensory: No sensory deficit.     Motor: No weakness.  Psychiatric:        Mood and Affect: Mood normal.    ED Results / Procedures / Treatments   Labs (all labs ordered are listed, but only abnormal results are displayed) Labs Reviewed - No data to display  EKG None  Radiology No results found.  Procedures Procedures    Medications Ordered in ED Medications  diphenhydrAMINE (BENADRYL) injection 25 mg (has no administration in time range)  methylPREDNISolone sodium succinate (SOLU-MEDROL) 125 mg/2 mL injection 125 mg (has no administration in time range)  famotidine (PEPCID) IVPB 20 mg premix (has no administration in time range)    ED Course/ Medical Decision Making/ A&P Clinical Course as of 11/22/21 0855  Sat Nov 21, 2021  2256 Reassessed patient there is been some slight improvement of final worsening.  Will discharge on short course of prednisone.  Return instructions discussed. [MB]    Clinical Course User Index [MB] Hayden Rasmussen, MD                           Medical Decision Making Risk Prescription drug management.  This patient complains of allergic reaction, hives tongue swelling; this involves an extensive number of treatment Options and is a complaint that carries with it a high risk of complications and morbidity. The differential includes urticaria, allergic reaction, anaphylaxis  I ordered medication IV steroids Benadryl and Pepcid and reviewed PMP when indicated. Additional history obtained from patient's mother Previous records obtained and reviewed in epic, no recent admissions  Cardiac monitoring reviewed, sinus rhythm Social determinants considered, tobacco use After the interventions stated above, I reevaluated the  patient and found patient to be satting well on room air in no distress. Admission and further testing considered, no indications for further work-up or admission.  Recommended symptomatic treatment at home and will prescribe short course of prednisone.  Return instructions discussed          Final Clinical Impression(s) / ED Diagnoses Final diagnoses:  Allergic reaction, initial encounter  Urticaria    Rx / DC Orders ED Discharge Orders          Ordered    predniSONE (DELTASONE) 20 MG tablet  Daily        11/21/21 2259              Hayden Rasmussen, MD 11/22/21 [phone]

## 2021-11-21 NOTE — ED Triage Notes (Signed)
Pt arrived from home w c/o allergic reacti on x 10 days. States that he has had hives  all over his body x 10 days. Tongue has been swelling nightly x 3 days. And new sx now feels tight in chest and hard to take a deep breath

## 2021-11-21 NOTE — Discharge Instructions (Signed)
You were seen in the emergency department for possible allergic reaction.  You received some medication here.  We are prescribing you 4 more days of prednisone.  Please observe for any triggers that may be associated with your hives.  Return to the emergency department if any worsening or concerning symptoms

## 2021-11-30 ENCOUNTER — Telehealth: Payer: Self-pay

## 2021-11-30 NOTE — Telephone Encounter (Signed)
Pt/mom contacted [facility]/[employer] by phone to inquire about process of enrolling into the Care Connect Uninsured Program ? ?Pt was interviewed regarding program requirements. Pt was found to meet requirements successfully.    ? ?An interview of past medical history and current medical needs were conducted.   Pt states he attended [facility] ER 2 weeks ago due to anxiety related issues and breaking out into hives.  Denies being diagnosed with anxiety and depression.   ?  ?All required documents on checklist were reviewed to have available during their appointment.(see appt calendar and note below) ? ?Appt was set for Fri, 3.10.23 at 10:30 am to be held with a Care Connect enrollment specialist at the [facility] and [facility] office ? ?Pt stated he understood program requirement and all information needed for appt.   Call ended. ? ? ? ?

## 2021-12-04 NOTE — Congregational Nurse Program (Signed)
?  Dept: [phone] ? ? ?Congregational Nurse Program Note ? ?Date of Encounter: 12/04/2021 ? ?Past Medical History: ?Past Medical History:  ?Diagnosis Date  ? Allergy   ? Anxiety   ? Tobacco use   ? ? ?Encounter Details: ? CNP Questionnaire - 12/04/21 1259   ? ?  ? Questionnaire  ? Do you give verbal consent to treat you today? Yes   ? Location Patient Served  [facility]   ? Visit Setting Phone/Text/Email;Church or Organization   ? Patient Status Unknown   ? Insurance Uninsured ([place] [employer] Connects/Self-Pay)   ? Insurance Referral [facility]   ? Medication Provided Medication Assistance   ? Medical Provider No   ? Screening Referrals N/A   ? Medical Referral Non-[facility] PCP/Clinic;Other   ? Medical Appointment Made Non-[facility] PCP/clinic   ? Food N/A   ? Transportation N/A   ? Housing/Utilities N/A   ? Interpersonal Safety N/A   ? Intervention Support;Counsel;Educate   ? ED Visit Averted N/A   ? Life-Saving Intervention Made N/A   ? ?  ?  ? ?  ? ?Client recent visit to ER for "whelps" He reports they improved and he took the prednisone. Client does have old areas that are visibile on his arms. He is scratching his face during enrollment. He reports he feels some of this may be "my nerves" he has not been on any medications for anxiety. He reports he was anxious about coming in today and that has made these worse. He is here today with his mother, but he lives with his brother who helps pay the bills. Client is unemployed. ? ?client in to enroll today in care connect. He is eligible from 11/25/21 -12/05/22. ?all docs scanned into fhases. ?[facility] will [facility] care [facility], appt made for 12/07/21 at 1pm appt card given. CM card given. ?[facility] FA was faxed for expedited review and approval. ?Medassist application sent to Alexia Freestone at [facility] for review. ?client allowed to tour food pantry may shop after appt on Monday. ?Discussed SW interns following up this week as well as myself.  ?will  plan to contact after first visit.  ?Care Connect card given to client today.  ? ?called client to talk privately away from his Mother PHQ 9 score 17 today. client denies SI/HI but has thoughts at times of being better off dead, but has no plans  to harm himself and reports no prior suicidal attempts. Discussed with client there are options for Mental health services if needed and we could discuss those after his PCP visit. Client reports I feel like " my nerves are bad" he reports the whelps and also have stomach discomfort frequently. Encouraged him to discuss all this with provider. We did discuss [facility] does have a [facility] available on tuesdays and wednesdays also for counseling services.  ? ?Will plan follow up after his first visit and refer to other resources as needed. ? ?Norval Gable RN ?

## 2021-12-07 ENCOUNTER — Telehealth: Payer: Self-pay

## 2021-12-07 ENCOUNTER — Ambulatory Visit: Payer: Medicaid Other | Admitting: Physician Assistant

## 2021-12-07 ENCOUNTER — Encounter: Payer: Self-pay | Admitting: Physician Assistant

## 2021-12-07 ENCOUNTER — Ambulatory Visit: Payer: Self-pay | Admitting: Physician Assistant

## 2021-12-07 VITALS — BP 133/81 | HR 88 | Temp 97.8°F | Ht 72.5 in | Wt 178.0 lb

## 2021-12-07 DIAGNOSIS — R1310 Dysphagia, unspecified: Secondary | ICD-10-CM

## 2021-12-07 DIAGNOSIS — Z13220 Encounter for screening for lipoid disorders: Secondary | ICD-10-CM

## 2021-12-07 DIAGNOSIS — F17200 Nicotine dependence, unspecified, uncomplicated: Secondary | ICD-10-CM

## 2021-12-07 DIAGNOSIS — Z8719 Personal history of other diseases of the digestive system: Secondary | ICD-10-CM

## 2021-12-07 DIAGNOSIS — F419 Anxiety disorder, unspecified: Secondary | ICD-10-CM

## 2021-12-07 DIAGNOSIS — R9389 Abnormal findings on diagnostic imaging of other specified body structures: Secondary | ICD-10-CM

## 2021-12-07 DIAGNOSIS — Z7689 Persons encountering health services in other specified circumstances: Secondary | ICD-10-CM

## 2021-12-07 DIAGNOSIS — R911 Solitary pulmonary nodule: Secondary | ICD-10-CM

## 2021-12-07 MED ORDER — PAROXETINE HCL 20 MG PO TABS: 20.0000 mg | ORAL_TABLET | Freq: Every day | ORAL | 0 refills | Status: DC

## 2021-12-07 MED ORDER — PANTOPRAZOLE SODIUM 40 MG PO TBEC: 40.0000 mg | DELAYED_RELEASE_TABLET | Freq: Every day | ORAL | 3 refills | Status: DC

## 2021-12-07 NOTE — Congregational Nurse Program (Signed)
client in today for assistance with CAFA.He was started on Paxil and Protonix and he was able to get his meds today. He states his visit went well, he is to get labs drawn and also a CT scan of lungs. ? Reviewed checklist for CAFA. ?ID is in system. He has no income, [employer] will get notarized LOS from mom today. client given check list to get for CAFA. Award letter from [employer] of food stamps and instructed client how to call IRS for non-filing transcript, instructed to press option 3 on the automated system. It will come in the mail in 7-10 days so once he gets the IRS letter and Food stamp award letter to bring those back to [place].  ? ?client has no bank account and he has no income. ? ?Client also approved for MedAssist until 11/26/22 will update care coordination note in EPIC. ? ?Francee Nodal RN ?[employer] ?

## 2021-12-07 NOTE — Progress Notes (Signed)
? ?BP 133/81   Pulse 88   Temp 97.8 ?F (36.6 ?C)   Ht 6' 0.5" (1.842 m)   Wt 178 lb (80.7 kg)   SpO2 99%   BMI 23.81 kg/m?   ? ?Subjective:  ? ? Patient ID: Jeffrey Wyatt, male    DOB: 12-Jun-1981, 41 y.o.   MRN: [id number] ? ?HPI: ?Jeffrey Wyatt is a 41 y.o. male presenting on 12/07/2021 for New Patient (Initial Visit) ? ? ?HPI ? ? ?Chief Complaint  ?Patient presents with  ? New Patient (Initial Visit)  ? ? ? ? ?He hasn't worked in over a year.  He used to work in a [employer].   ? ?He is still having the rash but says it isn't bad now.    He was Seen in ER for this 11/21/21.  It started about a  month ago. ? ?He has Nasal congestion for about a month.  Also coughing.  No fevers.  ? ?He continues with the congestion despite all the antihistamines he has been taking.   ? ?He has No HA or sinus pressure.   ? ?He has bad anxiety for about 7 or 8 months.   It is worse when he is around a bunch of people.    He has never taken medication for anxiety.   ? ?He says a spot was found on his right lung years ago but he never followed up.   ?05/15/2019-  CT chest- 29m nodule with rec to f/u 6-12 months ? ?In ER 10/03/18- he reported insomnia and some anxiety with trying to sleeep.  At that time he was working at [employer] ? ?He still feels like he has trouble swallowing.  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 (in 2016.   Dr Jena Gauss) ? ?Pt has No diarrhea.   ? ? ?Relevant past medical, surgical, family and social history reviewed and updated as indicated. Interim medical history since our last visit reviewed. ?Allergies and medications reviewed and updated. ? ? ?Current Outpatient Medications:  ?  diphenhydrAMINE (BENADRYL) 25 mg capsule, Take 25 mg by mouth 2 (two) times daily., Disp: , Rfl:  ? ? ? ? ?Review of Systems ? ?Per HPI unless specifically indicated above ? ?   ?Objective:  ?  ?BP 133/81   Pulse 88   Temp 97.8 ?F (36.6 ?C)   Ht 6' 0.5" (1.842 m)   Wt 178 lb  (80.7 kg)   SpO2 99%   BMI 23.81 kg/m?   ?Wt Readings from Last 3 Encounters:  ?12/07/21 178 lb (80.7 kg)  ?07/14/21 165 lb (74.8 kg)  ?05/19/21 167 lb 5.3 oz (75.9 kg)  ?  ?Physical Exam ?Vitals reviewed.  ?Constitutional:   ?   General: He is not in acute distress. ?   Appearance: He is well-developed. He is not ill-appearing.  ?HENT:  ?   Head: Normocephalic and atraumatic.  ?   Right Ear: Tympanic membrane, ear canal and external ear normal.  ?   Left Ear: Tympanic membrane, ear canal and external ear normal.  ?Eyes:  ?   Extraocular Movements: Extraocular movements intact.  ?   Conjunctiva/sclera: Conjunctivae normal.  ?   Pupils: Pupils are equal, round, and reactive to light.  ?Neck:  ?   Thyroid: No thyromegaly.  ?Cardiovascular:  ?   Rate and Rhythm: Normal rate and regular rhythm.  ?Pulmonary:  ?   Effort: Pulmonary effort is normal.  ?  Breath sounds: Normal breath sounds. No wheezing or rales.  ?Abdominal:  ?   General: Bowel sounds are normal.  ?   Palpations: Abdomen is soft. There is no mass.  ?   Tenderness: There is no abdominal tenderness.  ?Musculoskeletal:  ?   Cervical back: Neck supple.  ?   Right lower leg: No edema.  ?   Left lower leg: No edema.  ?Lymphadenopathy:  ?   Cervical: No cervical adenopathy.  ?Skin: ?   General: Skin is warm and dry.  ?   Comments: Excoriations all extremities.  No secondary infection seen.   ?Neurological:  ?   Mental Status: He is alert and oriented to person, place, and time.  ?   Motor: Tremor present. No weakness.  ?   Gait: Gait is intact.  ?   Deep Tendon Reflexes:  ?   Reflex Scores: ?     Patellar reflexes are 2+ on the right side and 2+ on the left side. ?Psychiatric:     ?   Attention and Perception: Attention normal.     ?   Behavior: Behavior normal.  ? ? ? ? ? ?   ?Assessment & Plan:  ? ? ?Encounter Diagnoses  ?Name Primary?  ? Encounter to establish care Yes  ? Anxiety   ? Screening cholesterol level   ? Pulmonary nodule   ? Dysphagia,  unspecified type   ? Abnormal CT of the chest   ? Lung nodule   ? Tobacco use disorder   ? History of esophagitis   ? ? ? ?-will check labs ?-Start paxil.  Discussed with pt that it may take 2-3 wk before he notices changes and may take up to 6 weeks to get full effect ?-scheduled Appointment with [facility] ?-at appointment discussed getting Barium swallow due to history no stricture.  After pt left office, endoscopy report from 2016 was reviewed and pt did not have stricture but instead had erosive esophagitis.  Will not order barium swallow but instead order ppi.  CMA to call pt to notify of change in plans ?-pt was given C[facility]/application for [facility] financial assistance ?-pt encouraged to use Claritin or zyrtec to help his rhinitis and rash ?-order CT chest -f/u nodule ?-pt was educated and encouraged to get covid vaccination ?-pt to follow up 3 weeks.  He is to contact office sooner prn ? ? ?

## 2021-12-07 NOTE — Patient Instructions (Addendum)
COVID-19 vaccination significantly lowers your risk of severe illness, hospitalization, and death if you get infected. Compared to people who are up to date with their COVID-19 vaccinations, unvaccinated people aremore likely to get COVID-19, much more likely to be hospitalized with COVID-19, and much more likely to die from COVID-19. ?Like all vaccines, COVID-19 vaccines are not 100% effective at preventing infection. Some people who are up to date with their COVID-19 vaccinations will get COVID-19 breakthrough infection. However, staying up to date with your COVID-19 vaccinations means that you are less likely to have a breakthrough infection and, if you do get sick, you are less likely to get severely ill or die. Staying up to date with COVID-19 vaccination also means you are less likely to spread the disease to others and increases your protection against new variants of SARS-CoV-2, the virus that causes COVID-19. ? ?----------------------------------------------- ? ? ?Get covid vaccination ?Blood tests at [facility] (fasting)- no appointment needed ?Barium Swallow study- you will be called with appointment ?Application for [facility] [employer] financial assistance ?Claritin or zyrtec (anti-histamine) ?Chest CT- you will be called with appointment ?Start paxil (paroxetine) ?

## 2021-12-07 NOTE — Telephone Encounter (Signed)
Called pt to inform per chart he previously had esophagitis & not a stricture therefore no barium swallow necessary. Rx of Pantoprazole will be called in for relief with coupon. No answer from pt, left vm to call back. ?

## 2021-12-07 NOTE — Telephone Encounter (Signed)
Called pt to inform CT scheduled 01/13/22, & advised of esophagitis instead of stricture & informed no barium swallow needed, informed pantoprazole sent to pharmacy & coupon sent to phone. ?

## 2021-12-08 ENCOUNTER — Other Ambulatory Visit (HOSPITAL_COMMUNITY)
Admission: RE | Admit: 2021-12-08 | Discharge: 2021-12-08 | Disposition: A | Discharge status: Home / Self Care | Payer: Medicaid Other | Source: Ambulatory Visit | Attending: Physician Assistant | Admitting: Physician Assistant

## 2021-12-08 ENCOUNTER — Other Ambulatory Visit: Payer: Self-pay | Admitting: Physician Assistant

## 2021-12-08 DIAGNOSIS — R911 Solitary pulmonary nodule: Secondary | ICD-10-CM | POA: Insufficient documentation

## 2021-12-08 DIAGNOSIS — F419 Anxiety disorder, unspecified: Secondary | ICD-10-CM | POA: Insufficient documentation

## 2021-12-08 DIAGNOSIS — Z13220 Encounter for screening for lipoid disorders: Secondary | ICD-10-CM | POA: Insufficient documentation

## 2021-12-08 DIAGNOSIS — F17200 Nicotine dependence, unspecified, uncomplicated: Secondary | ICD-10-CM | POA: Insufficient documentation

## 2021-12-08 DIAGNOSIS — R9389 Abnormal findings on diagnostic imaging of other specified body structures: Secondary | ICD-10-CM | POA: Insufficient documentation

## 2021-12-08 LAB — COMPREHENSIVE METABOLIC PANEL
ALT: 14 U/L (ref 0–44)
AST: 16 U/L (ref 15–41)
Albumin: 4 g/dL (ref 3.5–5.0)
Alkaline Phosphatase: 65 U/L (ref 38–126)
Anion gap: 10 (ref 5–15)
BUN: 6 mg/dL (ref 6–20)
CO2: 25 mmol/L (ref 22–32)
Calcium: 8.9 mg/dL (ref 8.9–10.3)
Chloride: 102 mmol/L (ref 98–111)
Creatinine, Ser: 0.82 mg/dL (ref 0.61–1.24)
GFR, Estimated: >60 mL/min (ref >60)
Glucose, Bld: 99 mg/dL (ref 70–99)
Potassium: 3.1 mmol/L — ABNORMAL LOW (ref 3.5–5.1)
Sodium: 137 mmol/L (ref 135–145)
Total Bilirubin: 0.1 mg/dL — ABNORMAL LOW (ref 0.3–1.2)
Total Protein: 7.2 g/dL (ref 6.5–8.1)

## 2021-12-08 LAB — LIPID PANEL
Cholesterol: 214 mg/dL — ABNORMAL HIGH (ref 0–200)
HDL: 39 mg/dL — ABNORMAL LOW (ref >40)
LDL Cholesterol: 150 mg/dL — ABNORMAL HIGH (ref 0–99)
Total CHOL/HDL Ratio: 5.5 RATIO
Triglycerides: 125 mg/dL (ref <150)
VLDL: 25 mg/dL (ref 0–40)

## 2021-12-08 LAB — TSH: TSH: 0.768 u[IU]/mL (ref 0.350–4.500)

## 2021-12-08 MED ORDER — POTASSIUM CHLORIDE CRYS ER 20 MEQ PO TBCR: 20.0000 meq | EXTENDED_RELEASE_TABLET | Freq: Two times a day (BID) | ORAL | 0 refills | Status: DC

## 2021-12-15 ENCOUNTER — Ambulatory Visit: Payer: Medicaid Other | Admitting: Licensed Clinical Social Worker

## 2021-12-15 DIAGNOSIS — F419 Anxiety disorder, unspecified: Secondary | ICD-10-CM

## 2021-12-15 NOTE — Progress Notes (Signed)
[facility] engaged patient in initial [facility] session. [facility] provided active listening and validation as patient shared about anxiety, stressors, support system, and coping strategies. No SI/HI. Sessions will be biweekly to start. ?

## 2021-12-23 ENCOUNTER — Telehealth: Payer: Self-pay

## 2021-12-23 NOTE — Telephone Encounter (Signed)
Attempted to call client for follow up and to check on the status of obtaining documents needed to submit [facility] [employer] assistance application.  ?No answer, and unable to leave voicemail as mailbox is full. ? ?Francee Nodal RN ?[employer] ?

## 2021-12-28 ENCOUNTER — Other Ambulatory Visit (HOSPITAL_COMMUNITY)
Admission: RE | Admit: 2021-12-28 | Discharge: 2021-12-28 | Disposition: A | Discharge status: Home / Self Care | Payer: Medicaid Other | Source: Ambulatory Visit | Attending: Physician Assistant | Admitting: Physician Assistant

## 2021-12-28 ENCOUNTER — Ambulatory Visit: Payer: Medicaid Other | Admitting: Physician Assistant

## 2021-12-28 ENCOUNTER — Encounter: Payer: Self-pay | Admitting: Physician Assistant

## 2021-12-28 ENCOUNTER — Other Ambulatory Visit: Payer: Self-pay | Admitting: Physician Assistant

## 2021-12-28 VITALS — BP 130/81 | HR 86 | Temp 97.2°F | Wt 169.0 lb

## 2021-12-28 DIAGNOSIS — K2970 Gastritis, unspecified, without bleeding: Secondary | ICD-10-CM

## 2021-12-28 DIAGNOSIS — E876 Hypokalemia: Secondary | ICD-10-CM | POA: Insufficient documentation

## 2021-12-28 DIAGNOSIS — F419 Anxiety disorder, unspecified: Secondary | ICD-10-CM

## 2021-12-28 DIAGNOSIS — E785 Hyperlipidemia, unspecified: Secondary | ICD-10-CM

## 2021-12-28 DIAGNOSIS — F17200 Nicotine dependence, unspecified, uncomplicated: Secondary | ICD-10-CM

## 2021-12-28 DIAGNOSIS — R911 Solitary pulmonary nodule: Secondary | ICD-10-CM

## 2021-12-28 LAB — POTASSIUM: Potassium: 3.4 mmol/L — ABNORMAL LOW (ref 3.5–5.1)

## 2021-12-28 MED ORDER — POTASSIUM CHLORIDE CRYS ER 20 MEQ PO TBCR: 20.0000 meq | EXTENDED_RELEASE_TABLET | Freq: Two times a day (BID) | ORAL | 0 refills | Status: DC

## 2021-12-28 MED ORDER — PAROXETINE HCL 20 MG PO TABS: 20.0000 mg | ORAL_TABLET | Freq: Every day | ORAL | 0 refills | Status: DC

## 2021-12-28 MED ORDER — PANTOPRAZOLE SODIUM 40 MG PO TBEC: 40.0000 mg | DELAYED_RELEASE_TABLET | Freq: Every day | ORAL | 3 refills | Status: DC

## 2021-12-28 NOTE — Progress Notes (Signed)
? ?BP 130/81   Pulse 86   Temp (!) 97.2 ?F (36.2 ?C)   Wt 169 lb (76.7 kg)   SpO2 98%   BMI 22.61 kg/m?   ? ?Subjective:  ? ? Patient ID: Jeffrey Wyatt, male    DOB: 10/12/1980, 41 y.o.   MRN: [id number] ? ?HPI: ?SHAFT TIA is a 41 y.o. male presenting on 12/28/2021 for Follow-up ? ? ?HPI ? ?Pt is 71yoM who presents for follow up to his new patient appointment a month ago.  ?He did not get his ppi. ?He hasn't noticed much difference from being on the paxil.  He Still is feeling anxious at certain times.   ?He took his K+ until finished.  ?He has not yet submitted cafa.  He is Waiting on tax paper to come in the mail.   ?He has no new issues today.  ? ? ? ?Relevant past medical, surgical, family and social history reviewed and updated as indicated. Interim medical history since our last visit reviewed. ?Allergies and medications reviewed and updated. ? ? ? ?Current Outpatient Medications:  ?  PARoxetine (PAXIL) 20 MG tablet, Take 1 tablet (20 mg total) by mouth daily., Disp: 30 tablet, Rfl: 0 ?  diphenhydrAMINE (BENADRYL) 25 mg capsule, Take 25 mg by mouth 2 (two) times daily. (Patient not taking: Reported on 12/28/2021), Disp: , Rfl:  ?  pantoprazole (PROTONIX) 40 MG tablet, Take 1 tablet (40 mg total) by mouth daily. (Patient not taking: Reported on 12/28/2021), Disp: 30 tablet, Rfl: 3 ?  potassium chloride SA (KLOR-CON M) 20 MEQ tablet, Take 1 tablet (20 mEq total) by mouth 2 (two) times daily. (Patient not taking: Reported on 12/28/2021), Disp: 10 tablet, Rfl: 0 ? ? ? ? ? ?Review of Systems ? ?Per HPI unless specifically indicated above ? ?   ?Objective:  ?  ?BP 130/81   Pulse 86   Temp (!) 97.2 ?F (36.2 ?C)   Wt 169 lb (76.7 kg)   SpO2 98%   BMI 22.61 kg/m?   ?Wt Readings from Last 3 Encounters:  ?12/28/21 169 lb (76.7 kg)  ?12/07/21 178 lb (80.7 kg)  ?07/14/21 165 lb (74.8 kg)  ?  ?Physical Exam ?Vitals reviewed.  ?Constitutional:   ?   General: He is not in acute distress. ?   Appearance:  He is well-developed. He is not ill-appearing.  ?HENT:  ?   Head: Normocephalic and atraumatic.  ?Cardiovascular:  ?   Rate and Rhythm: Normal rate and regular rhythm.  ?Pulmonary:  ?   Effort: Pulmonary effort is normal.  ?   Breath sounds: Normal breath sounds. No wheezing.  ?Abdominal:  ?   General: Bowel sounds are normal.  ?   Palpations: Abdomen is soft.  ?   Tenderness: There is no abdominal tenderness.  ?Musculoskeletal:  ?   Cervical back: Neck supple.  ?Lymphadenopathy:  ?   Cervical: No cervical adenopathy.  ?Skin: ?   General: Skin is warm and dry.  ?   Comments: Sores on arms from picking. Skin picked away from around nails, mostly on thumbs.  No secondary infection seen.  ?Neurological:  ?   Mental Status: He is alert and oriented to person, place, and time.  ?Psychiatric:     ?   Attention and Perception: Attention normal.     ?   Mood and Affect: Mood is anxious.     ?   Behavior: Behavior normal. Behavior is cooperative.  ?  Comments: Still anxious but much improved from appointment last month  ? ? ?Results for orders placed or performed during the hospital encounter of 12/08/21  ?Lipid panel  ?Result Value Ref Range  ? Cholesterol 214 (H) 0 - 200 mg/dL  ? Triglycerides 125 <150 mg/dL  ? HDL 39 (L) >40 mg/dL  ? Total CHOL/HDL Ratio 5.5 RATIO  ? VLDL 25 0 - 40 mg/dL  ? LDL Cholesterol 150 (H) 0 - 99 mg/dL  ?TSH  ?Result Value Ref Range  ? TSH 0.768 0.350 - 4.500 uIU/mL  ?Comprehensive metabolic panel  ?Result Value Ref Range  ? Sodium 137 135 - 145 mmol/L  ? Potassium 3.1 (L) 3.5 - 5.1 mmol/L  ? Chloride 102 98 - 111 mmol/L  ? CO2 25 22 - 32 mmol/L  ? Glucose, Bld 99 70 - 99 mg/dL  ? BUN 6 6 - 20 mg/dL  ? Creatinine, Ser 0.82 0.61 - 1.24 mg/dL  ? Calcium 8.9 8.9 - 10.3 mg/dL  ? Total Protein 7.2 6.5 - 8.1 g/dL  ? Albumin 4.0 3.5 - 5.0 g/dL  ? AST 16 15 - 41 U/L  ? ALT 14 0 - 44 U/L  ? Alkaline Phosphatase 65 38 - 126 U/L  ? Total Bilirubin 0.1 (L) 0.3 - 1.2 mg/dL  ? GFR, Estimated >60 >60 mL/min  ?  Anion gap 10 5 - 15  ? ?   ?Assessment & Plan:  ? ?Encounter Diagnoses  ?Name Primary?  ? Anxiety Yes  ? Hypokalemia   ? Pulmonary nodule   ? Hyperlipidemia, unspecified hyperlipidemia type   ? Gastritis, presence of bleeding unspecified, unspecified chronicity, unspecified gastritis type   ? Tobacco use disorder   ? ? ? ?-reviewed labs with pt  ?-Recheck K+ ?-pt was Counseled on lowfat diet and gave reading information.  Will recheck lipids in future  ?-pt to Continue paxil    he has appt with [facility] tomorrow ?-pt to Get on ppi/pantoprazole for trouble swallowing/gastritis.  He is counseled on condition and lifestyle changes to help this including food choices, avoiding alcohol and smoking, avoiding nsaids ?-CT appt in April as scheduled to recheck nodule ?-pt encouraged to Submit cafa ?-pt to follow-up 1 month to recheck dysphagia and anxiety.  He is to contact office sooner prn ? ? ?

## 2021-12-28 NOTE — Patient Instructions (Addendum)
High Cholesterol ?High cholesterol is a condition in which the blood has high levels of a white, waxy substance similar to fat (cholesterol). The liver makes all the cholesterol that the body needs. The human body needs small amounts of cholesterol to help build cells. A person gets extra or excess cholesterol from the food that he or she eats. ?The blood carries cholesterol from the liver to the rest of the body. If you have high cholesterol, deposits (plaques) may build up on the walls of your arteries. Arteries are the blood vessels that carry blood away from your heart. These plaques make the arteries narrow and stiff. ?Cholesterol plaques increase your risk for heart attack and stroke. Work with your health care provider to keep your cholesterol levels in a healthy range. ?What increases the risk? ?The following factors may make you more likely to develop this condition: ?Eating foods that are high in animal fat (saturated fat) or cholesterol. ?Being overweight. ?Not getting enough exercise. ?A family history of high cholesterol (familial hypercholesterolemia). ?Use of tobacco products. ?Having diabetes. ?What are the signs or symptoms? ?In most cases, high cholesterol does not usually cause any symptoms. ?In severe cases, very high cholesterol levels can cause: ?Fatty bumps under the skin (xanthomas). ?A white or gray ring around the black center (pupil) of the eye. ?How is this diagnosed? ?This condition may be diagnosed based on the results of a blood test. ?If you are older than 43 years of age, your health care provider may check your cholesterol levels every 4-6 years. ?You may be checked more often if you have high cholesterol or other risk factors for heart disease. ?The blood test for cholesterol measures: ?"Bad" cholesterol, or LDL cholesterol. This is the main type of cholesterol that causes heart disease. The desired level is less than 100 mg/dL (2.59 mmol/L). ?"Good" cholesterol, or HDL  cholesterol. HDL helps protect against heart disease by cleaning the arteries and carrying the LDL to the liver for processing. The desired level for HDL is 60 mg/dL (1.55 mmol/L) or higher. ?Triglycerides. These are fats that your body can store or burn for energy. The desired level is less than 150 mg/dL (1.69 mmol/L). ?Total cholesterol. This measures the total amount of cholesterol in your blood and includes LDL, HDL, and triglycerides. The desired level is less than 200 mg/dL (5.17 mmol/L). ?How is this treated? ?Treatment for high cholesterol starts with lifestyle changes, such as diet and exercise. ?Diet changes. You may be asked to eat foods that have more fiber and less saturated fats or added sugar. ?Lifestyle changes. These may include regular exercise, maintaining a healthy weight, and quitting use of tobacco products. ?Medicines. These are given when diet and lifestyle changes have not worked. You may be prescribed a statin medicine to help lower your cholesterol levels. ?Follow these instructions at home: ?Eating and drinking ? ?Eat a healthy, balanced diet. This diet includes: ?Daily servings of a variety of fresh, frozen, or canned fruits and vegetables. ?Daily servings of whole grain foods that are rich in fiber. ?Foods that are low in saturated fats and trans fats. These include poultry and fish without skin, lean cuts of meat, and low-fat dairy products. ?A variety of fish, especially oily fish that contain omega-3 fatty acids. Aim to eat fish at least 2 times a week. ?Avoid foods and drinks that have added sugar. ?Use healthy cooking methods, such as roasting, grilling, broiling, baking, poaching, steaming, and stir-frying. Do not fry your food except for stir-frying. ?  If you drink alcohol: ?Limit how much you have to: ?0-1 drink a day for women who are not pregnant. ?0-2 drinks a day for men. ?Know how much alcohol is in a drink. In the [place] one drink equals one 12 oz bottle of beer (355 mL),  one 5 oz glass of wine (148 mL), or one 1? oz glass of hard liquor (44 mL). ?Lifestyle ? ?Get regular exercise. Aim to exercise for a total of 150 minutes a week. Increase your activity level by doing activities such as gardening, walking, and taking the stairs. ?Do not use any products that contain nicotine or tobacco. These products include cigarettes, chewing tobacco, and vaping devices, such as e-cigarettes. If you need help quitting, ask your health care provider. ?General instructions ?Take over-the-counter and prescription medicines only as told by your health care provider. ?Keep all follow-up visits. This is important. ?Where to find more information ?American Heart Association: www.heart.org ?National Heart, Lung, and Blood Institute: PopSteam.is ?Contact a health care provider if: ?You have trouble achieving or maintaining a healthy diet or weight. ?You are starting an exercise program. ?You are unable to stop smoking. ?Get help right away if: ?You have chest pain. ?You have trouble breathing. ?You have discomfort or pain in your jaw, neck, back, shoulder, or arm. ?You have any symptoms of a stroke. "BE FAST" is an easy way to remember the main warning signs of a stroke: ?B - Balance. Signs are dizziness, sudden trouble walking, or loss of balance. ?E - Eyes. Signs are trouble seeing or a sudden change in vision. ?F - Face. Signs are sudden weakness or numbness of the face, or the face or eyelid drooping on one side. ?A - Arms. Signs are weakness or numbness in an arm. This happens suddenly and usually on one side of the body. ?S - Speech. Signs are sudden trouble speaking, slurred speech, or trouble understanding what people say. ?T - Time. Time to call emergency services. Write down what time symptoms started. ?You have other signs of a stroke, such as: ?A sudden, severe headache with no known cause. ?Nausea or vomiting. ?Seizure. ?These symptoms may represent a serious problem that is an  emergency. Do not wait to see if the symptoms will go away. Get medical help right away. Call your local emergency services (911 in the [place] Do not drive yourself to the hospital. ?Summary ?Cholesterol plaques increase your risk for heart attack and stroke. Work with your health care provider to keep your cholesterol levels in a healthy range. ?Eat a healthy, balanced diet, get regular exercise, and maintain a healthy weight. ?Do not use any products that contain nicotine or tobacco. These products include cigarettes, chewing tobacco, and vaping devices, such as e-cigarettes. ?Get help right away if you have any symptoms of a stroke. ?This information is not intended to replace advice given to you by your health care provider. Make sure you discuss any questions you have with your health care provider. ?Document Revised: 11/27/2020 Document Reviewed: 11/17/2020 ?Elsevier Patient Education ? 2022 Elsevier Inc. ? ? ?---------------------------------------------------------------------------------------------------- ? ? ?COVID-19 vaccination significantly lowers your risk of severe illness, hospitalization, and death if you get infected. Compared to people who are up to date with their COVID-19 vaccinations, unvaccinated people aremore likely to get COVID-19, much more likely to be hospitalized with COVID-19, and much more likely to die from COVID-19. ?Like all vaccines, COVID-19 vaccines are not 100% effective at preventing infection. Some people who are up to date with  their COVID-19 vaccinations will get COVID-19 breakthrough infection. However, staying up to date with your COVID-19 vaccinations means that you are less likely to have a breakthrough infection and, if you do get sick, you are less likely to get severely ill or die. Staying up to date with COVID-19 vaccination also means you are less likely to spread the disease to others and increases your protection against new variants of SARS-CoV-2, the virus  that causes COVID-19. ? ?--------------------------------------------------------------------------------------------- ? ? ? ? ?Gastritis, Adult ?Gastritis is inflammation of the stomach. There are two kinds of gastritis: ?

## 2021-12-29 ENCOUNTER — Ambulatory Visit: Payer: Medicaid Other | Admitting: Licensed Clinical Social Worker

## 2021-12-29 DIAGNOSIS — F419 Anxiety disorder, unspecified: Secondary | ICD-10-CM

## 2021-12-29 NOTE — Progress Notes (Signed)
[facility] engaged patient in follow-up appointment. [facility] provided active listening and validation as patient shared about anxiety related to various stressors. [facility] led patient in 4-7-8 breathing exercise and thought identification/challenging using CBT. ?

## 2022-01-12 ENCOUNTER — Ambulatory Visit: Payer: Medicaid Other | Admitting: Licensed Clinical Social Worker

## 2022-01-13 ENCOUNTER — Ambulatory Visit (HOSPITAL_COMMUNITY)
Admission: RE | Admit: 2022-01-13 | Discharge: 2022-01-13 | Disposition: A | Discharge status: Home / Self Care | Payer: Self-pay | Source: Ambulatory Visit | Attending: Physician Assistant | Admitting: Physician Assistant

## 2022-01-13 DIAGNOSIS — F17200 Nicotine dependence, unspecified, uncomplicated: Secondary | ICD-10-CM | POA: Insufficient documentation

## 2022-01-13 DIAGNOSIS — R911 Solitary pulmonary nodule: Secondary | ICD-10-CM | POA: Insufficient documentation

## 2022-01-13 DIAGNOSIS — R9389 Abnormal findings on diagnostic imaging of other specified body structures: Secondary | ICD-10-CM | POA: Insufficient documentation

## 2022-01-14 ENCOUNTER — Other Ambulatory Visit: Payer: Self-pay | Admitting: Physician Assistant

## 2022-01-14 DIAGNOSIS — E876 Hypokalemia: Secondary | ICD-10-CM

## 2022-01-19 ENCOUNTER — Ambulatory Visit: Payer: Medicaid Other | Admitting: Licensed Clinical Social Worker

## 2022-01-19 ENCOUNTER — Other Ambulatory Visit (HOSPITAL_COMMUNITY)
Admission: RE | Admit: 2022-01-19 | Discharge: 2022-01-19 | Disposition: A | Discharge status: Home / Self Care | Payer: Medicaid Other | Source: Ambulatory Visit | Attending: Physician Assistant | Admitting: Physician Assistant

## 2022-01-19 DIAGNOSIS — E876 Hypokalemia: Secondary | ICD-10-CM | POA: Insufficient documentation

## 2022-01-19 DIAGNOSIS — F419 Anxiety disorder, unspecified: Secondary | ICD-10-CM

## 2022-01-19 LAB — POTASSIUM: Potassium: 3.7 mmol/L (ref 3.5–5.1)

## 2022-01-19 NOTE — Progress Notes (Signed)
[facility] engaged patient in follow-up session. [facility] led patient in "happy place" meditation related to past resource of client's. Therapist provided active listening and validation as patient shared about anxiety related to upcoming court case and social anxiety. Client agreed to go into public place, before next session, for ten minutes and notice mood and somatic changes. ?

## 2022-01-25 ENCOUNTER — Encounter: Payer: Self-pay | Admitting: Physician Assistant

## 2022-01-25 ENCOUNTER — Ambulatory Visit: Payer: Medicaid Other | Admitting: Physician Assistant

## 2022-01-25 VITALS — BP 120/73 | HR 93 | Temp 97.7°F | Wt 173.0 lb

## 2022-01-25 DIAGNOSIS — E876 Hypokalemia: Secondary | ICD-10-CM

## 2022-01-25 DIAGNOSIS — F419 Anxiety disorder, unspecified: Secondary | ICD-10-CM

## 2022-01-25 DIAGNOSIS — K2970 Gastritis, unspecified, without bleeding: Secondary | ICD-10-CM

## 2022-01-25 DIAGNOSIS — R9389 Abnormal findings on diagnostic imaging of other specified body structures: Secondary | ICD-10-CM

## 2022-01-25 DIAGNOSIS — R911 Solitary pulmonary nodule: Secondary | ICD-10-CM

## 2022-01-25 DIAGNOSIS — E785 Hyperlipidemia, unspecified: Secondary | ICD-10-CM

## 2022-01-25 DIAGNOSIS — F17200 Nicotine dependence, unspecified, uncomplicated: Secondary | ICD-10-CM

## 2022-01-25 MED ORDER — CITALOPRAM HYDROBROMIDE 20 MG PO TABS: 20.0000 mg | ORAL_TABLET | Freq: Every day | ORAL | 0 refills | Status: AC

## 2022-01-25 MED ORDER — PANTOPRAZOLE SODIUM 40 MG PO TBEC: 40.0000 mg | DELAYED_RELEASE_TABLET | Freq: Every day | ORAL | 3 refills | Status: AC

## 2022-01-25 NOTE — Patient Instructions (Addendum)
COVID-19 vaccination significantly lowers your risk of severe illness, hospitalization, and death if you get infected. Compared to people who are up to date with their COVID-19 vaccinations, unvaccinated people aremore likely to get COVID-19, much more likely to be hospitalized with COVID-19, and much more likely to die from COVID-19. ?Like all vaccines, COVID-19 vaccines are not 100% effective at preventing infection. Some people who are up to date with their COVID-19 vaccinations will get COVID-19 breakthrough infection. However, staying up to date with your COVID-19 vaccinations means that you are less likely to have a breakthrough infection and, if you do get sick, you are less likely to get severely ill or die. Staying up to date with COVID-19 vaccination also means you are less likely to spread the disease to others and increases your protection against new variants of SARS-CoV-2, the virus that causes COVID-19. ? ?--------------------------------------------------------------------- ? ?BLOOD TEST-  to recheck potassium-  week of May 8-12 ? ? ? ?

## 2022-01-25 NOTE — Progress Notes (Signed)
? ?BP 120/73   Pulse 93   Temp 97.7 ?F (36.5 ?C)   Wt 173 lb (78.5 kg)   SpO2 91%   BMI 23.14 kg/m?   ? ?Subjective:  ? ? Patient ID: Jeffrey Wyatt, male    DOB: 1980/12/01, 41 y.o.   MRN: [id number] ? ?HPI: ?Jeffrey Wyatt is a 41 y.o. male presenting on 01/25/2022 for Anxiety and gastritis ? ? ?HPI ? ? ?Chief Complaint  ?Patient presents with  ? Anxiety  ? gastritis  ? ? ? ?He says he never picked up his pantoprazole.  He is using otc antacid pills.  ? ?He ran out of paxil about 3 days ago.  He says he couldn't tell any different.   He is taking some of his brother's girlfriend's clonazepam.    ? ?He finished portaassium about 2 wk ago.  ? ?He says he is now eating a lowfat diet to help his lipids.  ? ? ? ?Relevant past medical, surgical, family and social history reviewed and updated as indicated. Interim medical history since our last visit reviewed. ?Allergies and medications reviewed and updated. ? ? ?Current Outpatient Medications:  ?  pantoprazole (PROTONIX) 40 MG tablet, Take 1 tablet (40 mg total) by mouth daily., Disp: 30 tablet, Rfl: 3 ?  PARoxetine (PAXIL) 20 MG tablet, Take 1 tablet (20 mg total) by mouth daily., Disp: 30 tablet, Rfl: 0 ?  potassium chloride SA (KLOR-CON M) 20 MEQ tablet, Take 1 tablet (20 mEq total) by mouth 2 (two) times daily for 5 days., Disp: 10 tablet, Rfl: 0 ? ? ? ?Review of Systems ? ?Per HPI unless specifically indicated above ? ?   ?Objective:  ?  ?BP 120/73   Pulse 93   Temp 97.7 ?F (36.5 ?C)   Wt 173 lb (78.5 kg)   SpO2 91%   BMI 23.14 kg/m?   ?Wt Readings from Last 3 Encounters:  ?01/25/22 173 lb (78.5 kg)  ?12/28/21 169 lb (76.7 kg)  ?12/07/21 178 lb (80.7 kg)  ?  ?Physical Exam ?Vitals reviewed.  ?Constitutional:   ?   General: He is not in acute distress. ?   Appearance: He is well-developed. He is not ill-appearing.  ?HENT:  ?   Head: Normocephalic and atraumatic.  ?Cardiovascular:  ?   Rate and Rhythm: Normal rate and regular rhythm.   ?Pulmonary:  ?   Effort: Pulmonary effort is normal.  ?   Breath sounds: Normal breath sounds. No wheezing.  ?Abdominal:  ?   General: Bowel sounds are normal.  ?   Palpations: Abdomen is soft.  ?   Tenderness: There is no abdominal tenderness.  ?Musculoskeletal:  ?   Cervical back: Neck supple.  ?   Right lower leg: No edema.  ?   Left lower leg: No edema.  ?Lymphadenopathy:  ?   Cervical: No cervical adenopathy.  ?Skin: ?   General: Skin is warm and dry.  ?Neurological:  ?   Mental Status: He is alert and oriented to person, place, and time.  ?Psychiatric:     ?   Attention and Perception: Attention normal.     ?   Mood and Affect: Mood is anxious.     ?   Speech: Speech normal.     ?   Behavior: Behavior normal. Behavior is cooperative.  ? ? ?Results for orders placed or performed during the hospital encounter of 01/19/22  ?Potassium  ?Result Value Ref Range  ? Potassium 3.7 3.5 -  5.1 mmol/L  ? ?   ?Assessment & Plan:  ? ? ?Encounter Diagnoses  ?Name Primary?  ? Anxiety Yes  ? Gastritis, presence of bleeding unspecified, unspecified chronicity, unspecified gastritis type   ? Tobacco use disorder   ? Pulmonary nodule   ? Hypokalemia   ? Hyperlipidemia, unspecified hyperlipidemia type   ? Abnormal CT of the chest   ? ? ? ? ?-he is sttill seeing [facility]/counselor ?Trial of citalopram.  Discussed with pt reasons for not prescribing benzodiazepines ? ?-Re-sent rx protonix.  He is encouraged to star taking it to help his gastritis ? ?Reviewed results CT scan.  New opaticty [facility]- recommend f/u CT 6-12 months.   ? ?Discussed Emphysema also evident on CT.  Pt was encouraged and counseled on smoking cessation ? ?reCheck potassium next week.  ? ?Educated and encouraged pt to get Covid vaccination ? ?Pt to follow up 2-3 weeks to see how the citalopram is working.  He is to contact office sooner prn ? ? ? ? ?

## 2022-02-02 ENCOUNTER — Ambulatory Visit: Payer: Medicaid Other | Admitting: Licensed Clinical Social Worker

## 2022-02-15 ENCOUNTER — Ambulatory Visit: Payer: Medicaid Other | Admitting: Physician Assistant

## 2022-08-25 IMAGING — DX DG CHEST 2V
2 series · 3 of 3 positions shown · non-contrast
Comparison: 05/19/2021

CLINICAL DATA: Shortness of breath and dizziness

EXAM:
CHEST - 2 VIEW

[chest pa]
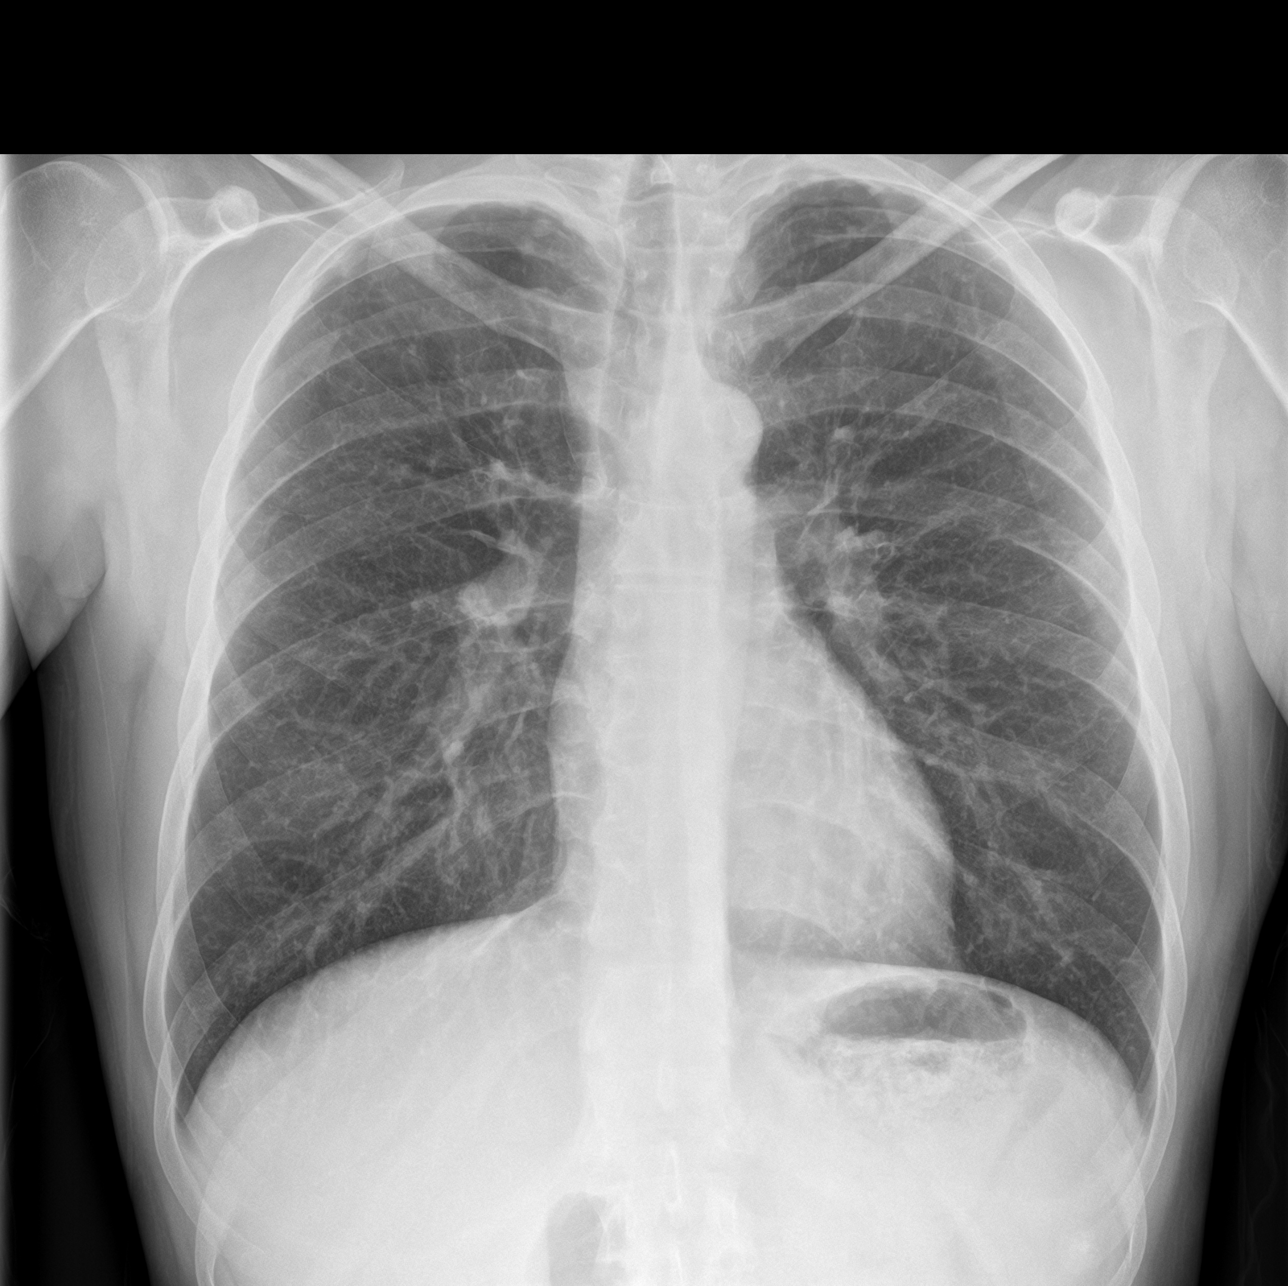

[Series 2: chest lat · 0.14mm/px · 2 of 2 slices shown]
[im 1/2]
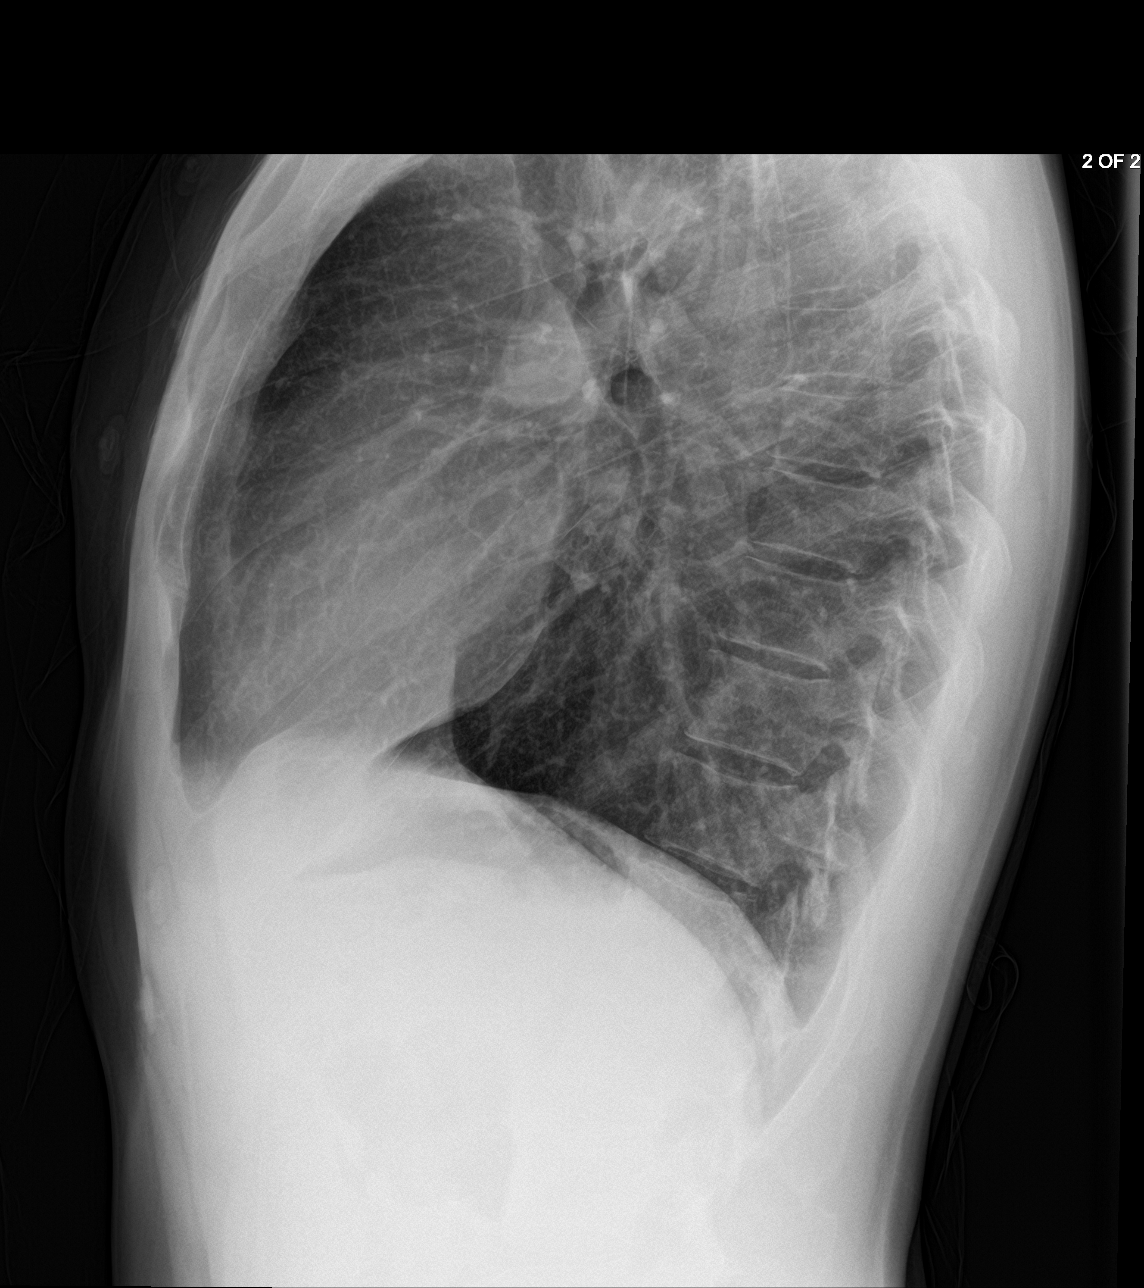
[im 2/2]
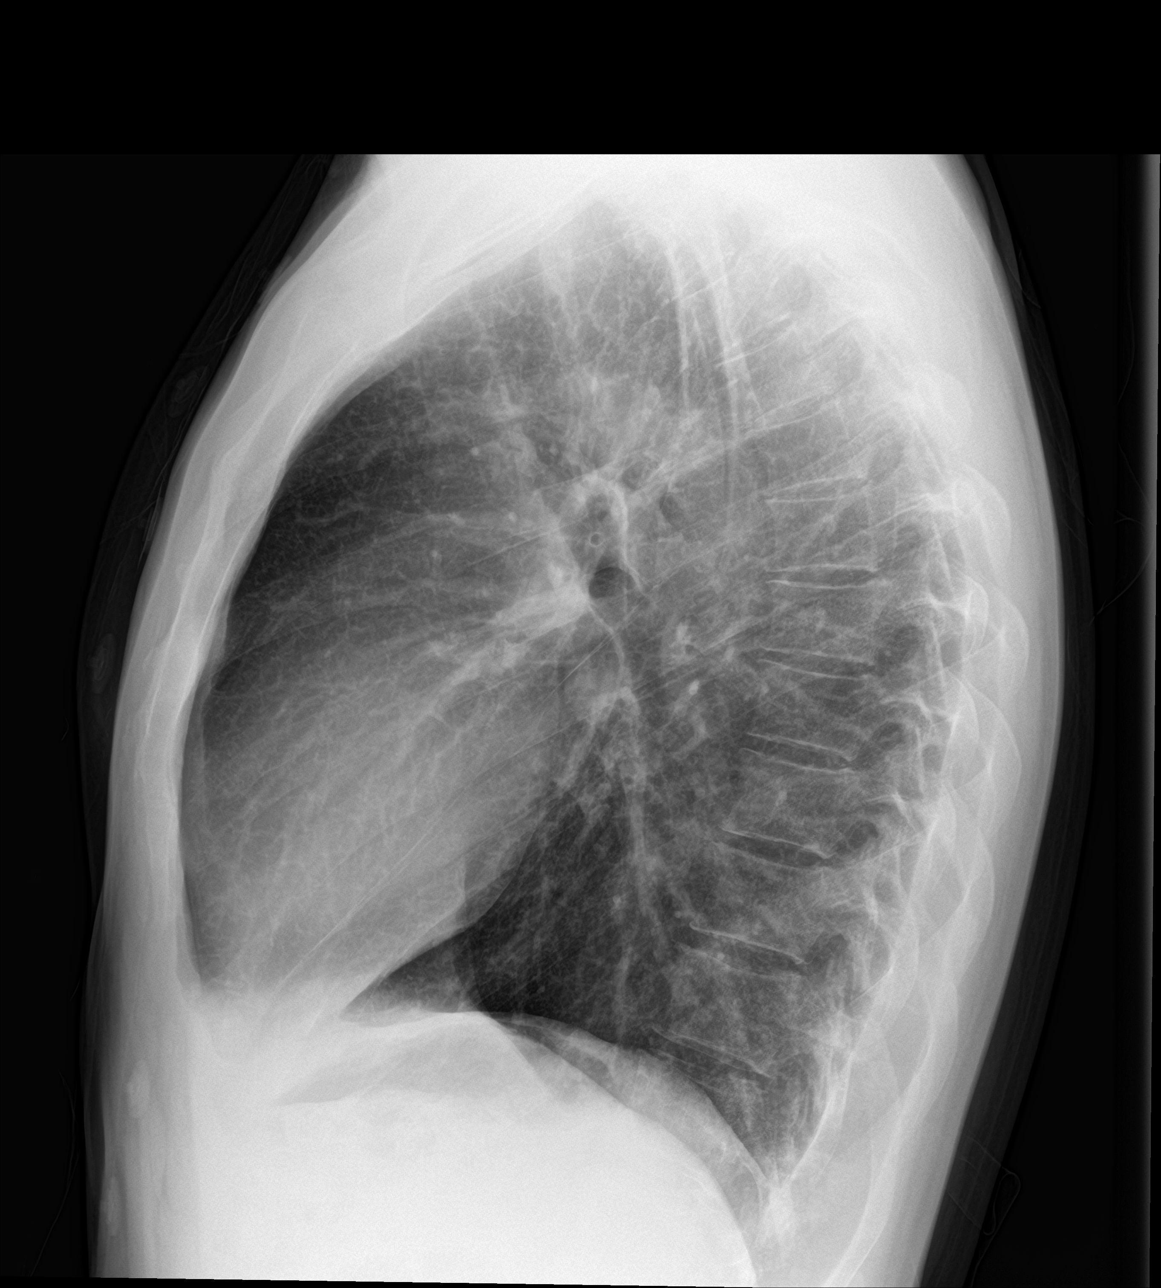

[3 of 3 positions shown; findings below may reference images not displayed]

FINDINGS: Cardiac shadow is within normal limits. Lungs are well aerated
bilaterally. No focal infiltrate or sizable effusion is seen. No
bony abnormality is noted.
IMPRESSION: No acute abnormality noted.

## 2022-08-28 ENCOUNTER — Encounter (HOSPITAL_COMMUNITY): Payer: Self-pay | Admitting: Emergency Medicine

## 2022-08-28 ENCOUNTER — Other Ambulatory Visit: Payer: Self-pay

## 2022-08-28 ENCOUNTER — Emergency Department (HOSPITAL_COMMUNITY)
Admission: EM | Admit: 2022-08-28 | Discharge: 2022-08-28 | Disposition: A | Discharge status: Home / Self Care | Payer: Medicaid Other | Attending: Emergency Medicine | Admitting: Emergency Medicine

## 2022-08-28 DIAGNOSIS — T7840XA Allergy, unspecified, initial encounter: Secondary | ICD-10-CM | POA: Diagnosis not present

## 2022-08-28 MED ORDER — PREDNISONE 50 MG PO TABS: 50.0000 mg | ORAL_TABLET | Freq: Every day | ORAL | 0 refills | Status: DC

## 2022-08-28 MED ORDER — FAMOTIDINE IN NACL 20-0.9 MG/50ML-% IV SOLN
20.0000 mg | Freq: Once | INTRAVENOUS | Status: AC
  Administered 2022-08-28: 20 mg via INTRAVENOUS
  Filled 2022-08-28: qty 50

## 2022-08-28 MED ORDER — DIPHENHYDRAMINE HCL 50 MG/ML IJ SOLN
25.0000 mg | Freq: Once | INTRAMUSCULAR | Status: AC
  Administered 2022-08-28: 25 mg via INTRAVENOUS
  Filled 2022-08-28: qty 1

## 2022-08-28 MED ORDER — METHYLPREDNISOLONE SODIUM SUCC 125 MG IJ SOLR
125.0000 mg | Freq: Once | INTRAMUSCULAR | Status: AC
  Administered 2022-08-28: 125 mg via INTRAVENOUS
  Filled 2022-08-28: qty 2

## 2022-08-28 MED ORDER — EPINEPHRINE 0.3 MG/0.3ML IJ SOAJ
0.3000 mg | Freq: Once | INTRAMUSCULAR | Status: AC
  Administered 2022-08-28: 0.3 mg via INTRAMUSCULAR
  Filled 2022-08-28: qty 0.3

## 2022-08-28 NOTE — ED Provider Notes (Signed)
**Jeffrey Wyatt De-Identified via Obfuscation** Jeffrey Jeffrey Wyatt   CSN: [id number] Arrival date & time: 08/28/22  0146     History  Chief Complaint  Patient presents with   Allergic Reaction    Jeffrey Jeffrey Wyatt is a 41 y.o. male.  The history is provided by the patient.  Allergic Reaction He states that he ate some cheesy bread from [employer] and immediately started having swelling of his lips.  He has had reactions like this in the past.  He also is having some slight itching and has had some hives.  He denies any difficulty breathing or swallowing.  There has been no treatment at home.    Home Medications Prior to Admission medications   Medication Sig Start Date End Date Taking? Authorizing Provider  citalopram (CELEXA) 20 MG tablet Take 1 tablet (20 mg total) by mouth daily. 01/25/22   Jacquelin Hawking, PA-C  pantoprazole (PROTONIX) 40 MG tablet Take 1 tablet (40 mg total) by mouth daily. 01/25/22   Jacquelin Hawking, PA-C      Allergies    Patient has no known allergies.    Review of Systems   Review of Systems  All other systems reviewed and are negative.   Physical Exam Updated Vital Signs BP 139/89   Pulse (!) 103   Temp 98.1 F (36.7 C) (Oral)   Resp 16   Ht 6\' 1"  (1.854 m)   Wt 77.1 kg   SpO2 99%   BMI 22.43 kg/m  Physical Exam Vitals and nursing Jeffrey Wyatt reviewed.   41 year old male, resting comfortably and in no acute distress. Vital signs are significant for slightly elevated heart rate. Oxygen saturation is 99%, which is normal. Head is normocephalic and atraumatic. PERRLA, EOMI. There is moderate swelling of upper and lower lips.  There is mild swelling of the uvula.  There is no swelling of the tongue or sublingual tissues.  There is no pooling of secretions and phonation is normal. Neck is nontender and supple without adenopathy or JVD.  There is no stridor. Back is nontender and there is no CVA tenderness. Lungs are clear without rales, wheezes, or  rhonchi. Chest is nontender. Heart has regular rate and rhythm without murmur. Abdomen is soft, flat, nontender. Extremities have no cyanosis or edema, full range of motion is present. Skin: Scattered urticarial lesions present. Neurologic: Mental status is normal, cranial nerves are intact, there are moves all extremities equally.  ED Results / Procedures / Treatments    Procedures Procedures  Cardiac monitor shows normal sinus rhythm, per my interpretation.  Medications Ordered in ED Medications  EPINEPHrine (EPI-PEN) injection 0.3 mg (has no administration in time range)  diphenhydrAMINE (BENADRYL) injection 25 mg (has no administration in time range)  famotidine (PEPCID) IVPB 20 mg premix (has no administration in time range)  methylPREDNISolone sodium succinate (SOLU-MEDROL) 125 mg/2 mL injection 125 mg (has no administration in time range)    ED Course/ Medical Decision Making/ A&P                           Medical Decision Making Risk Prescription drug management.   Acute allergic reaction manifested by swelling of lips, uvula edema, urticaria.  Currently, no airway difficulty.  I have ordered intravenous diphenhydramine, famotidine, methylprednisolone and intramuscular epinephrine.  He will need to be observed in the emergency department.  I have reviewed his past records, and he had another ED visit for an allergic reaction on  11/21/2021.  4:22 AM Patient reevaluated.  Lip swelling has decreased but is still present.  Urticaria has resolved.  Patient states he feels much better.  6:12 AM Patient reevaluated.  Lip swelling is still present, but minimal.  He is safe for discharge.  I am discharging him with a prescription for a 5-day course of prednisone, told to use over-the-counter diphenhydramine as needed.  He needs to discuss with primary care provider possible referral to an allergist.  CRITICAL CARE Performed by: Dione Booze Total critical care time: 50  minutes Critical care time was exclusive of separately billable procedures and treating other patients. Critical care was necessary to treat or prevent imminent or life-threatening deterioration. Critical care was time spent personally by me on the following activities: development of treatment plan with patient and/or surrogate as well as nursing, discussions with consultants, evaluation of patient's response to treatment, examination of patient, obtaining history from patient or surrogate, ordering and performing treatments and interventions, ordering and review of laboratory studies, ordering and review of radiographic studies, pulse oximetry and re-evaluation of patient's condition.  Final Clinical Impression(s) / ED Diagnoses Final diagnoses:  Allergic reaction, initial encounter    Rx / DC Orders ED Discharge Orders          Ordered    predniSONE (DELTASONE) 50 MG tablet  Daily        08/28/22 0610              Dione Booze, MD 08/28/22 [phone]

## 2022-08-28 NOTE — ED Notes (Signed)
ED Provider at bedside.Dr Glick  

## 2022-08-28 NOTE — Discharge Instructions (Signed)
You may take diphenhydramine (Benadryl) as needed for itching.  Please discuss with your primary care provider possible referral to see an allergist to find out what you are reacting to.  If you know what you are allergic to, you can have a plan to avoid it.

## 2022-08-28 NOTE — ED Notes (Signed)
Swelling continues to decrease, pt reports continued improvement of sx

## 2022-08-28 NOTE — ED Notes (Signed)
ED Provider at bedside. 

## 2022-08-28 NOTE — ED Triage Notes (Signed)
Pt here for allergic reaction to something he ate approximately 30 mins ago. Here with swelling to top lip and states it feels like he is having trouble swallowing.

## 2023-01-17 ENCOUNTER — Ambulatory Visit: Payer: Medicaid Other | Admitting: Internal Medicine

## 2023-02-24 IMAGING — CT CT CHEST W/O CM
2 of 4 series · 14 of 36 positions shown, 17 images · non-contrast
Comparison: Prior CT scan of the chest 05/15/2019

CLINICAL DATA: A follow-up lung nodule, smoker.



[Series 2: routine chest without · axial · non-contrast · 0.72mm/px · z∈[+1208,+1468]mm · 11 of 154 slices shown, 14 images]
[im 12/154  mediastinal]
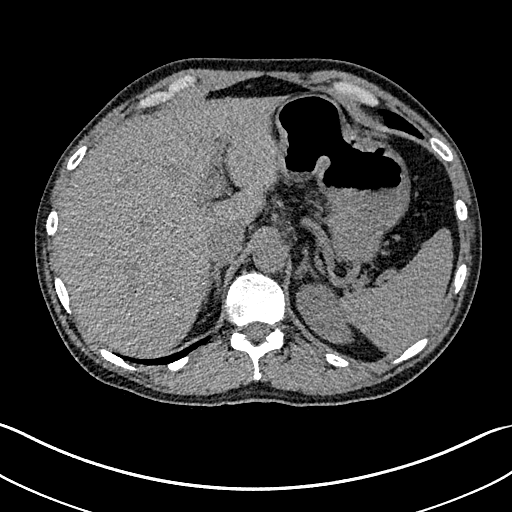
[im 12/154  lung]
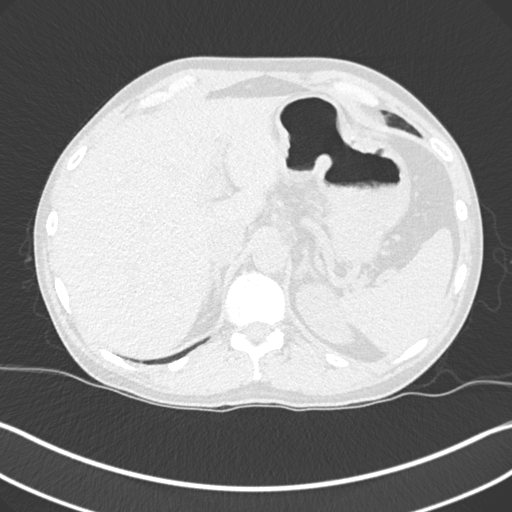
[im 24/154  lung]
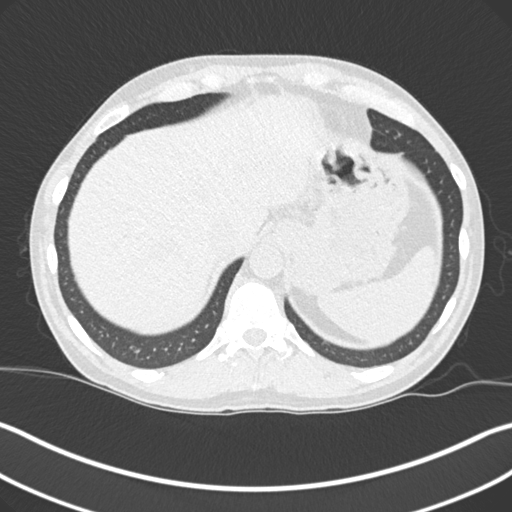
[im 36/154  lung]
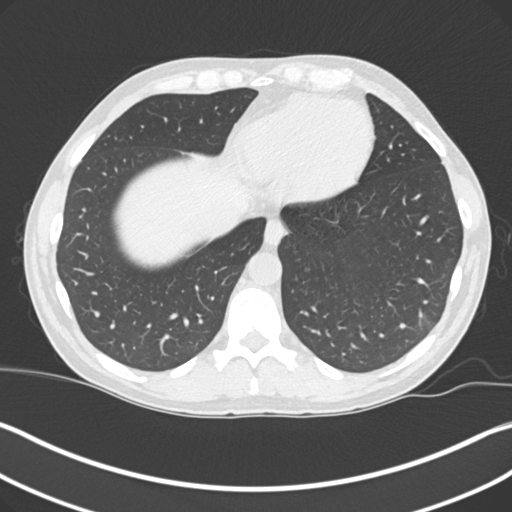
[im 48/154  lung]
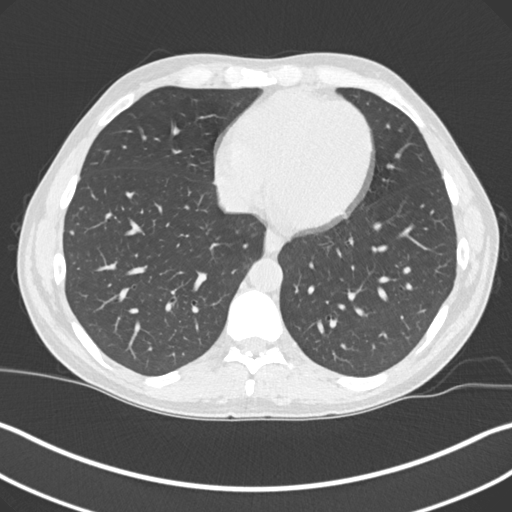
[im 59/154  mediastinal]
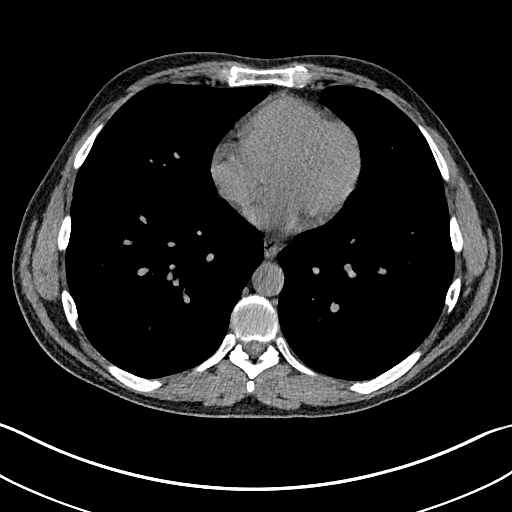
[im 59/154  lung]
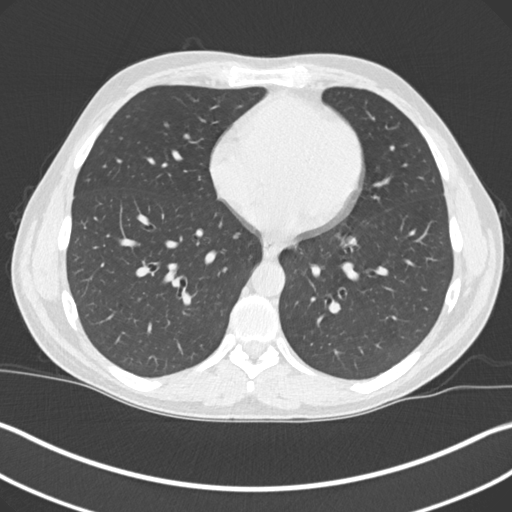
[im 83/154  lung]
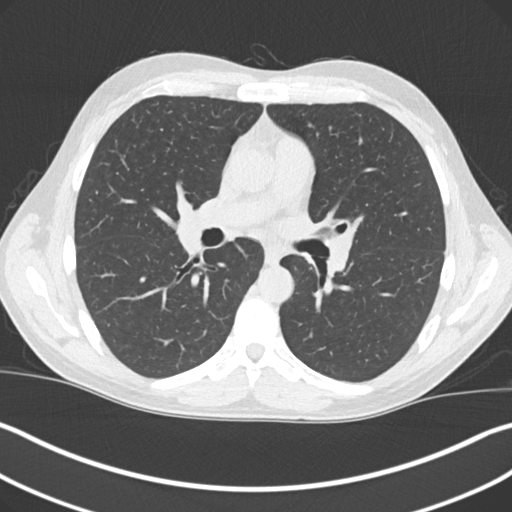
[im 95/154  lung]
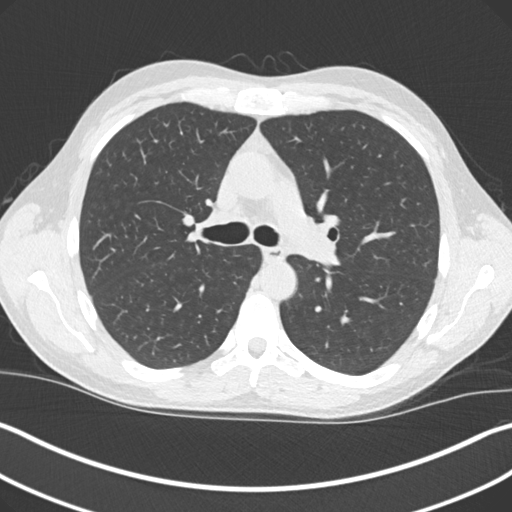
[im 106/154  lung]
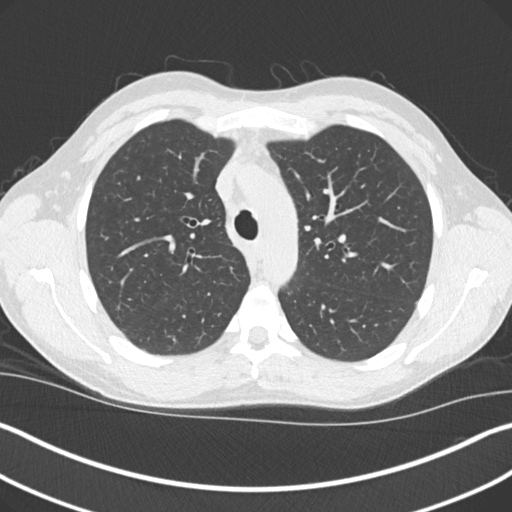
[im 118/154  mediastinal]
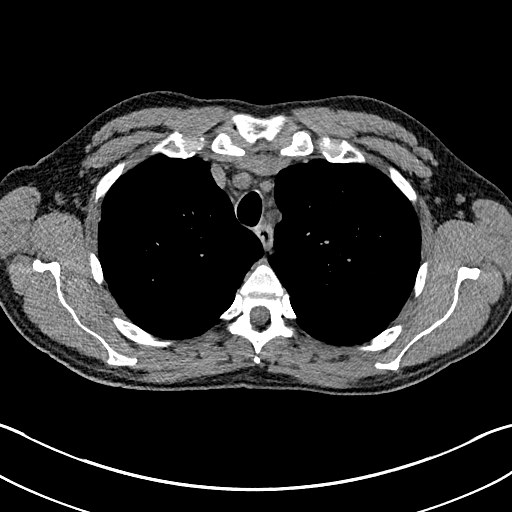
[im 118/154  lung]
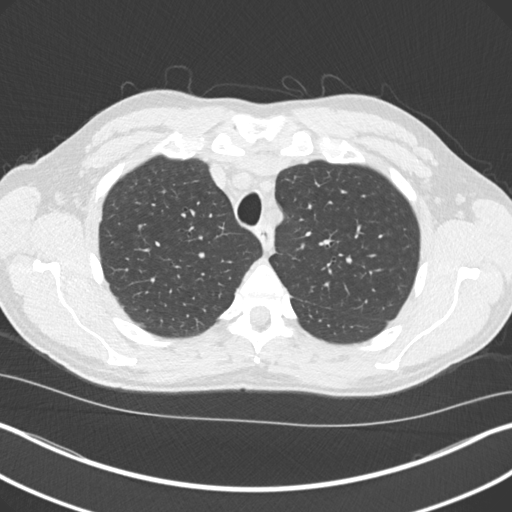
[im 130/154  lung]
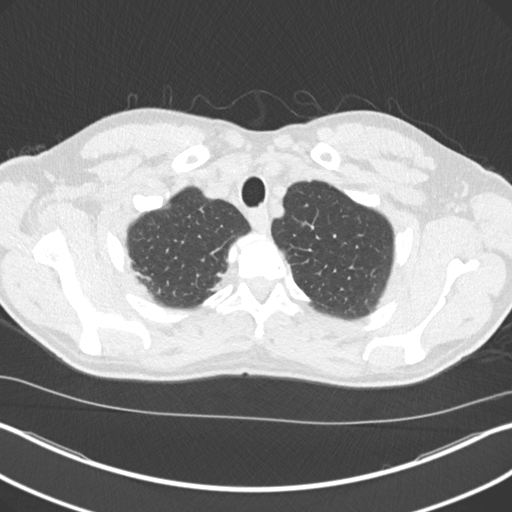
[im 142/154  lung]
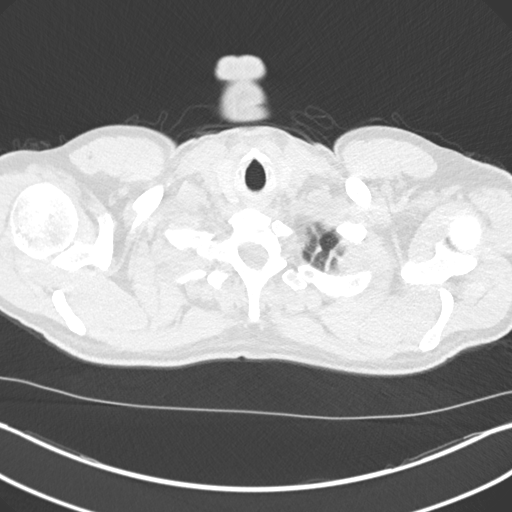

[Series 5: coronal · coronal · 0.64mm/px · 3 of 151 slices shown]
[im 31/151  lung]
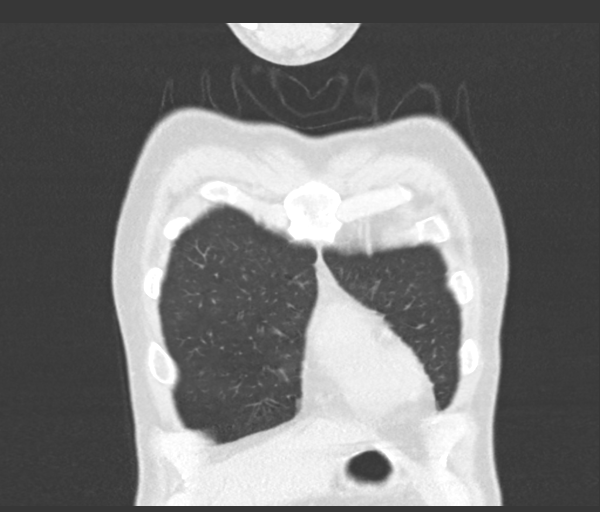
[im 61/151  lung]
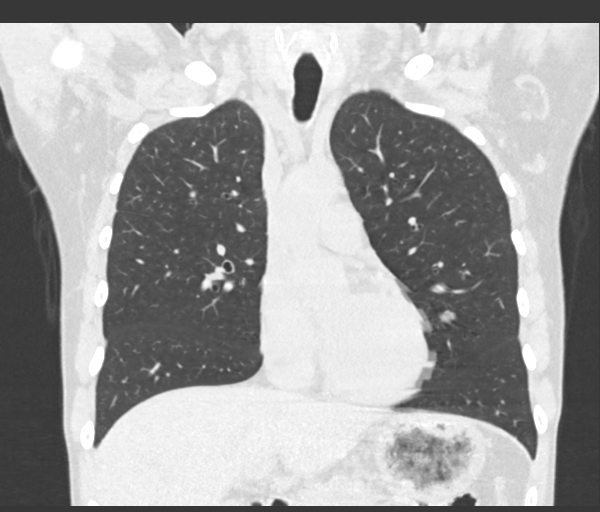
[im 91/151  lung]
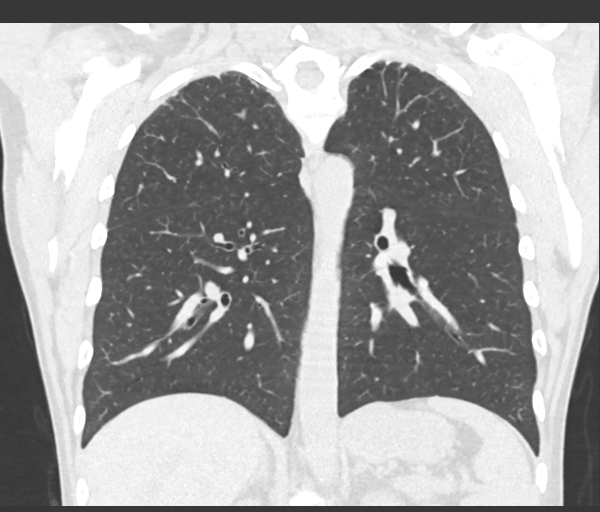

[14 of 36 positions shown; findings below may reference images not displayed]

FINDINGS: Cardiovascular: Limited evaluation in the absence of intravenous
contrast. 4 vessel aortic arch. The left vertebral artery arises
directly from the aorta. No evidence of aneurysm. No significant
atherosclerotic plaque. The heart is normal in size. No pericardial
effusion.

Mediastinum/Nodes: Unremarkable CT appearance of the thyroid gland.
No suspicious mediastinal or hilar adenopathy. No soft tissue
mediastinal mass. The thoracic esophagus is unremarkable.

Lungs/Pleura: Biapical pleuroparenchymal scarring is again
visualized. The nodular component at the right lung apex remains
unchanged dating back to 3434. By my measurement, the nodule
measures 9 mm on both scans although it was previously reported as 7
mm. This likely represents slight difference in measurement
technique. Stable small calcified subpleural granuloma along the
anterior aspect of the left upper lobe (image 27 series 4). Mild
emphysematous changes. Subtle 1 cm focus of rounded gland
ground-glass attenuation airspace opacity in the periphery of the
left lower lobe (image 120 series 4). This was not present on the
prior study.

Upper Abdomen: No acute abnormality.

Musculoskeletal: No acute fracture or aggressive appearing lytic or
blastic osseous lesion.
IMPRESSION: 1. Confirmed greater than 2 year stability of right apical nodular
pleuroparenchymal scarring. No further follow-up recommended for
this finding.
2. New 1 cm ground-glass attenuation nodular opacity in the
periphery of the left lower lobe. Differential considerations
include a focal infectious/inflammatory process versus a developing
low-grade adenocarcinoma/adenocarcinoma in situ. Initial follow-up
with CT at 6-12 months is recommended to confirm persistence. If
persistent, repeat CT is recommended every 2 years until 5 years of
stability has been established. This recommendation follows the
consensus statement: Guidelines for Management of Incidental
Pulmonary Nodules Detected on CT Images:From the [HOSPITAL]
4386; published online before print (10.1148/radiol.9874727221).
3. Trace emphysematous changes.  Emphysema ([id number]).

## 2023-05-14 ENCOUNTER — Other Ambulatory Visit: Payer: Self-pay

## 2023-05-14 ENCOUNTER — Emergency Department (HOSPITAL_COMMUNITY)
Admission: EM | Admit: 2023-05-14 | Discharge: 2023-05-14 | Disposition: A | Discharge status: Home / Self Care | Payer: Medicaid Other | Attending: Emergency Medicine | Admitting: Emergency Medicine

## 2023-05-14 ENCOUNTER — Emergency Department (HOSPITAL_COMMUNITY): Payer: Medicaid Other

## 2023-05-14 ENCOUNTER — Encounter (HOSPITAL_COMMUNITY): Payer: Self-pay | Admitting: *Deleted

## 2023-05-14 DIAGNOSIS — L509 Urticaria, unspecified: Secondary | ICD-10-CM | POA: Diagnosis not present

## 2023-05-14 DIAGNOSIS — R21 Rash and other nonspecific skin eruption: Secondary | ICD-10-CM | POA: Diagnosis present

## 2023-05-14 MED ORDER — LORATADINE 10 MG PO TABS
10.0000 mg | ORAL_TABLET | Freq: Once | ORAL | Status: AC
  Administered 2023-05-14: 10 mg via ORAL
  Filled 2023-05-14: qty 1

## 2023-05-14 MED ORDER — FAMOTIDINE 20 MG PO TABS
40.0000 mg | ORAL_TABLET | Freq: Once | ORAL | Status: AC
  Administered 2023-05-14: 40 mg via ORAL
  Filled 2023-05-14: qty 2

## 2023-05-14 MED ORDER — PREDNISONE 10 MG PO TABS: 40.0000 mg | ORAL_TABLET | Freq: Every day | ORAL | 0 refills | Status: AC

## 2023-05-14 MED ORDER — PREDNISONE 50 MG PO TABS
60.0000 mg | ORAL_TABLET | Freq: Once | ORAL | Status: AC
  Administered 2023-05-14: 60 mg via ORAL
  Filled 2023-05-14: qty 1

## 2023-05-14 NOTE — ED Provider Notes (Signed)
 [place] EMERGENCY DEPARTMENT AT [facility] Provider Note   CSN: [id number] Arrival date & time: 05/14/23  1356     History Chief Complaint  Patient presents with   Rash    Jeffrey Wyatt is a 42 y.o. male.  Patient presents to the emergency department concerns of a rash.  Reports the rash been ongoing for the last 3 weeks.  States the rash is primarily itching denies any pain or drainage coming from these areas.  Patient reports that rash is diffuse on anterior posterior trunk, upper and lower extremities.  Has tried taking Allegra for the last 2 days without improvement in symptoms.  Denies any acute respiratory concerns such as chest pain, shortness of breath, cough, congestion.   Rash      Home Medications Prior to Admission medications   Medication Sig Start Date End Date Taking? Authorizing Provider  predniSONE (DELTASONE) 10 MG tablet Take 4 tablets (40 mg total) by mouth daily for 5 days. 05/14/23 05/19/23 Yes Smitty Knudsen, PA-C  citalopram (CELEXA) 20 MG tablet Take 1 tablet (20 mg total) by mouth daily. 01/25/22   Jacquelin Hawking, PA-C  pantoprazole (PROTONIX) 40 MG tablet Take 1 tablet (40 mg total) by mouth daily. 01/25/22   Jacquelin Hawking, PA-C      Allergies    Patient has no known allergies.    Review of Systems   Review of Systems  Skin:  Positive for rash.  All other systems reviewed and are negative.   Physical Exam Updated Vital Signs BP 126/81   Pulse 90   Temp 98.2 F (36.8 C) (Oral)   Resp 16   Ht 6\' 1"  (1.854 m)   Wt 81.6 kg   SpO2 95%   BMI 23.75 kg/m  Physical Exam Vitals and nursing note reviewed.  Constitutional:      General: He is not in acute distress.    Appearance: He is well-developed.  HENT:     Head: Normocephalic and atraumatic.  Eyes:     Conjunctiva/sclera: Conjunctivae normal.  Cardiovascular:     Rate and Rhythm: Normal rate and regular rhythm.     Heart sounds: No murmur heard. Pulmonary:      Effort: Pulmonary effort is normal. No respiratory distress.     Breath sounds: Normal breath sounds.  Abdominal:     Palpations: Abdomen is soft.     Tenderness: There is no abdominal tenderness.  Musculoskeletal:        General: No swelling.     Cervical back: Neck supple.  Skin:    General: Skin is warm and dry.     Capillary Refill: Capillary refill takes less than 2 seconds.     Findings: Rash present.     Comments: Diffuse urticarial rash present on trunk, upper and lower extremities  Neurological:     Mental Status: He is alert.  Psychiatric:        Mood and Affect: Mood normal.     ED Results / Procedures / Treatments   Labs (all labs ordered are listed, but only abnormal results are displayed) Labs Reviewed - No data to display  EKG None  Radiology DG Chest 2 View  Result Date: 05/14/2023 CLINICAL DATA:  Shortness of breath EXAM: CHEST - 2 VIEW COMPARISON:  Chest x-ray 07/14/2021 FINDINGS: The heart size and mediastinal contours are within normal limits. Both lungs are clear. The visualized skeletal structures are unremarkable. IMPRESSION: No active cardiopulmonary disease. Electronically Signed  By: Darliss Cheney M.D.   On: 05/14/2023 16:26    Procedures Procedures   Medications Ordered in ED Medications  loratadine (CLARITIN) tablet 10 mg (10 mg Oral Given 05/14/23 1426)  famotidine (PEPCID) tablet 40 mg (40 mg Oral Given 05/14/23 1427)  predniSONE (DELTASONE) tablet 60 mg (60 mg Oral Given 05/14/23 1426)    ED Course/ Medical Decision Making/ A&P                               Medical Decision Making Risk OTC drugs.   This patient presents to the ED for concern of rash.  Differential diagnosis includes contact dermatitis, irritant dermatitis, allergic reaction, anaphylaxis, eczema   Imaging Studies ordered:  I ordered imaging studies including chest x-ray I independently visualized and interpreted imaging which showed no acute cardiopulmonary  process I agree with the radiologist interpretation   Medicines ordered and prescription drug management:  I ordered medication including famotidine, loratadine, prednisone for allergic reaction Reevaluation of the patient after these medicines showed that the patient improved I have reviewed the patients home medicines and have made adjustments as needed   Problem List / ED Course:  Patient presents to the emergency department concerns of a rash.  Reports this rash has been present for the last 3 weeks or so.  Does endorse some itching to the rash but states that it has been slowly improving.  Denies any obvious contact with any irritants as far as patient is aware.  Denies changing any household cleaners, soaps, or other products that may have caused symptoms.  Denies any razors sick contacts or exposure to other individuals with similar rash.  Denies any history of any anaphylactic reaction.  Based on presentation, do suspect this is likely a contact dermatitis but given general prolonged nature of symptoms, will administer antihistamines and steroid for evaluation of symptoms.  Pepcid, Claritin, prednisone ordered. Patient with symptomatic improvement with the Pepcid, Claritin, and prednisone.  The urticarial rash appears to be diminished on the left setting as noted by patient.  Patient is requesting chest x-ray given the recent shortness of breath that he is also been exhibiting. Chest x-ray is unremarkable for any acute findings such as pneumonia, bronchitis, viral process, or any masses.  Informed patient of these findings and patient feels reassured.  Will plan on discharging patient home with a course of prednisone to take for the next few days as well as instructions to avoid any possible contact irritants such as soaps, detergents, or any other potential irritants that patient may identify.  Patient is agreeable with treatment plan and verbalized understanding of return precautions.  Will  have patient follow-up outpatient with primary care provider.  All questions answered prior to patient discharge.  Final Clinical Impression(s) / ED Diagnoses Final diagnoses:  Urticaria    Rx / DC Orders ED Discharge Orders          Ordered    predniSONE (DELTASONE) 10 MG tablet  Daily        05/14/23 1651              Smitty Knudsen, PA-C 05/14/23 1656    Cathren Laine, MD 05/16/23 1237

## 2023-05-14 NOTE — Discharge Instructions (Signed)
You were seen in the ER today for a rash. Your rash appears it was likely due to contact with something that you may be allergic to such as a detergent or food. You were given Prednisone, Claritin, and Famotidine here in the ER with some improvement in the rash size and itchiness. I have sent a prescription for Prednisone to your pharmacy to continue taking for the next few days. You may continue taking Allegra for the next few days as well. For topical itching, you may try calamine lotion or topical Benadryl lotion.  Please plan on following up with your primary care provider.

## 2023-05-14 NOTE — ED Triage Notes (Addendum)
Pt with rash all over for past 3 weeks, + itching.  Taking OTC Allegra without relief.  Pt states feels hard to breathe for same time, no distress noted.  Denies anything new.

## 2023-06-28 ENCOUNTER — Other Ambulatory Visit (HOSPITAL_COMMUNITY): Payer: Self-pay | Admitting: Internal Medicine

## 2023-06-28 DIAGNOSIS — R911 Solitary pulmonary nodule: Secondary | ICD-10-CM

## 2023-07-06 ENCOUNTER — Encounter: Payer: Self-pay | Admitting: Internal Medicine

## 2023-07-12 ENCOUNTER — Encounter: Payer: Self-pay | Admitting: Internal Medicine

## 2023-10-28 ENCOUNTER — Other Ambulatory Visit (HOSPITAL_COMMUNITY): Payer: Self-pay | Admitting: Internal Medicine

## 2023-10-28 DIAGNOSIS — R911 Solitary pulmonary nodule: Secondary | ICD-10-CM

## 2023-11-08 ENCOUNTER — Encounter: Payer: Self-pay | Admitting: Internal Medicine

## 2023-11-08 ENCOUNTER — Ambulatory Visit (HOSPITAL_COMMUNITY): Payer: Medicaid Other | Attending: Internal Medicine

## 2023-11-08 ENCOUNTER — Encounter (HOSPITAL_COMMUNITY): Payer: Self-pay

## 2023-11-10 ENCOUNTER — Encounter (INDEPENDENT_AMBULATORY_CARE_PROVIDER_SITE_OTHER): Payer: Self-pay | Admitting: *Deleted

## 2023-11-27 ENCOUNTER — Ambulatory Visit (HOSPITAL_COMMUNITY)
Admission: RE | Admit: 2023-11-27 | Discharge: 2023-11-27 | Disposition: A | Discharge status: Home / Self Care | Payer: Medicaid Other | Source: Ambulatory Visit | Attending: Internal Medicine | Admitting: Internal Medicine

## 2023-11-27 DIAGNOSIS — R911 Solitary pulmonary nodule: Secondary | ICD-10-CM | POA: Diagnosis present
